# Patient Record
Sex: Female | Born: 1985 | Race: Black or African American | Hispanic: No | Marital: Single | Smoking: Former smoker
Health system: Southern US, Community
[De-identification: ages and names within clinical notes are randomized; demographics above are authoritative.]

## PROBLEM LIST (undated history)

## (undated) DIAGNOSIS — G43909 Migraine, unspecified, not intractable, without status migrainosus: Secondary | ICD-10-CM

## (undated) DIAGNOSIS — I10 Essential (primary) hypertension: Secondary | ICD-10-CM

---

## 2012-09-11 ENCOUNTER — Emergency Department (HOSPITAL_BASED_OUTPATIENT_CLINIC_OR_DEPARTMENT_OTHER)
Admission: EM | Admit: 2012-09-11 | Discharge: 2012-09-11 | Disposition: A | Discharge status: Home / Self Care | Payer: Self-pay | Attending: Emergency Medicine | Admitting: Emergency Medicine

## 2012-09-11 ENCOUNTER — Encounter (HOSPITAL_BASED_OUTPATIENT_CLINIC_OR_DEPARTMENT_OTHER): Payer: Self-pay

## 2012-09-11 DIAGNOSIS — R51 Headache: Secondary | ICD-10-CM | POA: Insufficient documentation

## 2012-09-11 DIAGNOSIS — Z87891 Personal history of nicotine dependence: Secondary | ICD-10-CM | POA: Insufficient documentation

## 2012-09-11 DIAGNOSIS — G43909 Migraine, unspecified, not intractable, without status migrainosus: Secondary | ICD-10-CM

## 2012-09-11 MED ORDER — METOCLOPRAMIDE HCL 5 MG/ML IJ SOLN
10.0000 mg | Freq: Once | INTRAMUSCULAR | Status: AC
  Administered 2012-09-11: 10 mg via INTRAMUSCULAR
  Filled 2012-09-11: qty 2

## 2012-09-11 MED ORDER — DIPHENHYDRAMINE HCL 50 MG/ML IJ SOLN
25.0000 mg | Freq: Once | INTRAMUSCULAR | Status: AC
  Administered 2012-09-11: 25 mg via INTRAMUSCULAR
  Filled 2012-09-11: qty 1

## 2012-09-11 MED ORDER — KETOROLAC TROMETHAMINE 60 MG/2ML IM SOLN
60.0000 mg | Freq: Once | INTRAMUSCULAR | Status: AC
  Administered 2012-09-11: 60 mg via INTRAMUSCULAR
  Filled 2012-09-11: qty 2

## 2012-09-11 NOTE — ED Provider Notes (Signed)
History     CSN: [id number]  Arrival date & time 09/11/12  4098   First MD Initiated Contact with Patient 09/11/12 0535      Chief Complaint  Patient presents with  . Headache    (Consider location/radiation/quality/duration/timing/severity/associated sxs/prior treatment) Patient is a 26 y.o. female presenting with headaches. The history is provided by the patient. No language interpreter was used.  Headache  This is a new problem. The current episode started more than 2 days ago. The problem occurs constantly. The problem has been gradually worsening. The headache is associated with emotional stress. The pain is located in the frontal region. The quality of the pain is described as throbbing. The pain is severe. The pain does not radiate. Pertinent negatives include no anorexia, no fever, no malaise/fatigue, no chest pressure, no near-syncope, no orthopnea, no palpitations, no syncope and no shortness of breath. She has tried NSAIDs for the symptoms. The treatment provided mild relief.    History reviewed. No pertinent past medical history.  History reviewed. No pertinent past surgical history.  History reviewed. No pertinent family history.  History  Substance Use Topics  . Smoking status: Former Games developer  . Smokeless tobacco: Not on file  . Alcohol Use: No    OB History    Grav Para Term Preterm Abortions TAB SAB Ect Mult Living                  Review of Systems  Constitutional: Negative for fever and malaise/fatigue.  HENT: Negative for neck pain and neck stiffness.   Eyes: Negative for photophobia and visual disturbance.  Respiratory: Negative for shortness of breath.   Cardiovascular: Negative for palpitations, orthopnea, syncope and near-syncope.  Gastrointestinal: Negative for anorexia.  Skin: Negative for rash.  Neurological: Positive for headaches. Negative for dizziness, tremors, seizures, syncope, facial asymmetry, speech difficulty, light-headedness and  numbness.  All other systems reviewed and are negative.    Allergies  Review of patient's allergies indicates no known allergies.  Home Medications  No current outpatient prescriptions on file.  BP 129/90  Pulse 64  Temp 97.8 F (36.6 C) (Oral)  Resp 20  SpO2 100%  Physical Exam  Constitutional: She is oriented to person, place, and time. She appears well-developed and well-nourished. No distress.  HENT:  Head: Normocephalic and atraumatic.  Right Ear: External ear normal.  Left Ear: External ear normal.  Mouth/Throat: Oropharynx is clear and moist.  Eyes: Conjunctivae normal and EOM are normal. Pupils are equal, round, and reactive to light.  Neck: Normal range of motion. Neck supple.  Cardiovascular: Normal rate and regular rhythm.   Pulmonary/Chest: Effort normal and breath sounds normal. She has no wheezes. She has no rales.  Abdominal: Soft. Bowel sounds are normal. There is no tenderness. There is no rebound and no guarding.  Musculoskeletal: Normal range of motion. She exhibits no edema.  Lymphadenopathy:    She has no cervical adenopathy.  Neurological: She is alert and oriented to person, place, and time. She has normal reflexes. No cranial nerve deficit.  Skin: Skin is warm and dry.  Psychiatric: She has a normal mood and affect.    ED Course  Procedures (including critical care time)   Labs Reviewed  PREGNANCY, URINE   No results found.   No diagnosis found.    MDM  No weakness nor numbness no changes in cognition.  No travel exposure no tick bites.  No indication for imaging nor LP at this time.  Return  for fevers, stiff neck weakness numbness or changes in cognition.          Jasmine Awe, MD 09/11/12 [phone]

## 2012-09-11 NOTE — ED Notes (Signed)
Patient waiting on mother to come to drive her home.

## 2012-09-11 NOTE — ED Notes (Signed)
Patient calling mother to transport her home.

## 2012-09-11 NOTE — ED Notes (Signed)
Pt sts has had H/A x 3 days became worst

## 2012-11-09 ENCOUNTER — Telehealth: Payer: Self-pay

## 2012-11-09 NOTE — Telephone Encounter (Signed)
TEST!!!

## 2013-05-19 ENCOUNTER — Emergency Department (HOSPITAL_BASED_OUTPATIENT_CLINIC_OR_DEPARTMENT_OTHER)
Admission: EM | Admit: 2013-05-19 | Discharge: 2013-05-19 | Disposition: A | Discharge status: Home / Self Care | Payer: Self-pay | Attending: Emergency Medicine | Admitting: Emergency Medicine

## 2013-05-19 ENCOUNTER — Encounter (HOSPITAL_BASED_OUTPATIENT_CLINIC_OR_DEPARTMENT_OTHER): Payer: Self-pay | Admitting: *Deleted

## 2013-05-19 DIAGNOSIS — Y929 Unspecified place or not applicable: Secondary | ICD-10-CM | POA: Insufficient documentation

## 2013-05-19 DIAGNOSIS — Z87891 Personal history of nicotine dependence: Secondary | ICD-10-CM | POA: Insufficient documentation

## 2013-05-19 DIAGNOSIS — T6391XA Toxic effect of contact with unspecified venomous animal, accidental (unintentional), initial encounter: Secondary | ICD-10-CM | POA: Insufficient documentation

## 2013-05-19 DIAGNOSIS — T63461A Toxic effect of venom of wasps, accidental (unintentional), initial encounter: Secondary | ICD-10-CM | POA: Insufficient documentation

## 2013-05-19 DIAGNOSIS — L299 Pruritus, unspecified: Secondary | ICD-10-CM | POA: Insufficient documentation

## 2013-05-19 DIAGNOSIS — Y9389 Activity, other specified: Secondary | ICD-10-CM | POA: Insufficient documentation

## 2013-05-19 MED ORDER — DIPHENHYDRAMINE HCL 50 MG/ML IJ SOLN
25.0000 mg | Freq: Once | INTRAMUSCULAR | Status: AC
  Administered 2013-05-19: 25 mg via INTRAMUSCULAR
  Filled 2013-05-19: qty 1

## 2013-05-19 MED ORDER — DIPHENHYDRAMINE HCL 25 MG PO TABS: 25.0000 mg | ORAL_TABLET | Freq: Four times a day (QID) | ORAL | Status: DC | PRN

## 2013-05-19 MED ORDER — CEPHALEXIN 500 MG PO CAPS: 500.0000 mg | ORAL_CAPSULE | Freq: Four times a day (QID) | ORAL | Status: DC

## 2013-05-19 MED ORDER — CEPHALEXIN 250 MG PO CAPS
250.0000 mg | ORAL_CAPSULE | Freq: Once | ORAL | Status: AC
  Administered 2013-05-19: 250 mg via ORAL
  Filled 2013-05-19: qty 1

## 2013-05-19 NOTE — ED Provider Notes (Signed)
Medical screening examination/treatment/procedure(s) were performed by non-physician practitioner and as supervising physician I was immediately available for consultation/collaboration.   Charles B. Sheldon, MD 05/19/13 1709 

## 2013-05-19 NOTE — ED Notes (Addendum)
Pt states she was stung by a bee yesterday on her right forearm and today area is more swollen and red. +itching. Area is approx 6"X3"

## 2013-05-19 NOTE — ED Provider Notes (Signed)
History     CSN: [id number]  Arrival date & time 05/19/13  1553   First MD Initiated Contact with Patient 05/19/13 1635      Chief Complaint  Patient presents with  . Insect Bite    (Consider location/radiation/quality/duration/timing/severity/associated sxs/prior treatment) HPI Comments: Patient is a 27 year old female who presents with a bee sting that occurred yesterday. Patient reports being stung by a bee on her right forearm yesterday and since then, has been experiencing increased redness to the affected area. Patient also reports the color change is spreading. Patient has not tried anything for symptoms. Patient reports associated tenderness to the area. No aggravating/alleviating factors.    History reviewed. No pertinent past medical history.  History reviewed. No pertinent past surgical history.  History reviewed. No pertinent family history.  History  Substance Use Topics  . Smoking status: Former Games developer  . Smokeless tobacco: Not on file  . Alcohol Use: No    OB History   Grav Para Term Preterm Abortions TAB SAB Ect Mult Living                  Review of Systems  Skin: Positive for color change.  All other systems reviewed and are negative.    Allergies  Review of patient's allergies indicates no known allergies.  Home Medications   Current Outpatient Rx  Name  Route  Sig  Dispense  Refill  . Norgestim-Eth Estrad Triphasic (ORTHO TRI-CYCLEN, 28, PO)   Oral   Take by mouth.           BP 124/76  Pulse 70  Temp(Src) 98.2 F (36.8 C) (Oral)  Resp 18  Ht 5\' 9"  (1.753 m)  Wt 140 lb (63.504 kg)  BMI 20.67 kg/m2  SpO2 100%  LMP 05/19/2013  Physical Exam  Nursing note and vitals reviewed. Constitutional: She is oriented to person, place, and time. She appears well-developed and well-nourished. No distress.  HENT:  Head: Normocephalic and atraumatic.  Eyes: Conjunctivae are normal.  Neck: Normal range of motion.  Cardiovascular: Normal  rate and regular rhythm.  Exam reveals no gallop and no friction rub.   No murmur heard. Pulmonary/Chest: Effort normal and breath sounds normal. She has no wheezes. She has no rales. She exhibits no tenderness.  Musculoskeletal: Normal range of motion.  Neurological: She is alert and oriented to person, place, and time. Coordination normal.  Speech is goal-oriented. Moves limbs without ataxia.   Skin: Skin is warm and dry.  6x3cm area of well-demarcated erythema and warmth. No open wound.  Psychiatric: She has a normal mood and affect. Her behavior is normal.    ED Course  Procedures (including critical care time)  Labs Reviewed - No data to display No results found.   1. Bee sting reaction, initial encounter       MDM  4:52 PM Patient will have IM benadryl for itching and likely localized allergic reaction. Patient afebrile with stable vitals. Patient will also have keflex here and be discharged with Keflex and benadryl. Patient instructed to return with worsening or concerning symptoms.         Emilia Beck, PA-C 05/19/13 1701

## 2015-03-31 ENCOUNTER — Encounter (HOSPITAL_BASED_OUTPATIENT_CLINIC_OR_DEPARTMENT_OTHER): Payer: Self-pay | Admitting: *Deleted

## 2015-03-31 ENCOUNTER — Emergency Department (HOSPITAL_BASED_OUTPATIENT_CLINIC_OR_DEPARTMENT_OTHER)
Admission: EM | Admit: 2015-03-31 | Discharge: 2015-03-31 | Disposition: A | Discharge status: Home / Self Care | Payer: 59 | Attending: Emergency Medicine | Admitting: Emergency Medicine

## 2015-03-31 DIAGNOSIS — Y9289 Other specified places as the place of occurrence of the external cause: Secondary | ICD-10-CM | POA: Diagnosis not present

## 2015-03-31 DIAGNOSIS — Z87891 Personal history of nicotine dependence: Secondary | ICD-10-CM | POA: Insufficient documentation

## 2015-03-31 DIAGNOSIS — Y9389 Activity, other specified: Secondary | ICD-10-CM | POA: Insufficient documentation

## 2015-03-31 DIAGNOSIS — W57XXXA Bitten or stung by nonvenomous insect and other nonvenomous arthropods, initial encounter: Secondary | ICD-10-CM | POA: Diagnosis not present

## 2015-03-31 DIAGNOSIS — L03115 Cellulitis of right lower limb: Secondary | ICD-10-CM | POA: Diagnosis not present

## 2015-03-31 DIAGNOSIS — L089 Local infection of the skin and subcutaneous tissue, unspecified: Secondary | ICD-10-CM

## 2015-03-31 DIAGNOSIS — Y998 Other external cause status: Secondary | ICD-10-CM | POA: Diagnosis not present

## 2015-03-31 DIAGNOSIS — S80861A Insect bite (nonvenomous), right lower leg, initial encounter: Secondary | ICD-10-CM | POA: Diagnosis present

## 2015-03-31 DIAGNOSIS — Z792 Long term (current) use of antibiotics: Secondary | ICD-10-CM | POA: Diagnosis not present

## 2015-03-31 MED ORDER — CEPHALEXIN 500 MG PO CAPS: 500.0000 mg | ORAL_CAPSULE | Freq: Two times a day (BID) | ORAL | Status: DC

## 2015-03-31 MED ORDER — DIPHENHYDRAMINE HCL 25 MG PO TABS: 25.0000 mg | ORAL_TABLET | Freq: Four times a day (QID) | ORAL | Status: DC

## 2015-03-31 MED ORDER — DIPHENHYDRAMINE HCL 25 MG PO CAPS
25.0000 mg | ORAL_CAPSULE | Freq: Once | ORAL | Status: AC
  Administered 2015-03-31: 25 mg via ORAL
  Filled 2015-03-31: qty 1

## 2015-03-31 NOTE — ED Provider Notes (Signed)
CSN: [id number]     Arrival date & time 03/31/15  2030 History   First MD Initiated Contact with Patient 03/31/15 2154     Chief Complaint  Patient presents with  . Insect Bite     (Consider location/radiation/quality/duration/timing/severity/associated sxs/prior Treatment) HPI  Pt is a 29yo female presenting to ED with c/o gradually worsening painful and pruritic pustule on Right lower leg that started yesterday. Pt concerned she was bit by a spider but did not see any insect.  Pain is 6/10.  No medication tried for symptoms. Denies any other rash. No known allergies. Denies fever, chills, n/v/d. Denies bleeding or discharge from pustule.  No other symptoms.   History reviewed. No pertinent past medical history. History reviewed. No pertinent past surgical history. No family history on file. History  Substance Use Topics  . Smoking status: Former Games developer  . Smokeless tobacco: Not on file  . Alcohol Use: No   OB History    No data available     Review of Systems  Constitutional: Negative for fever and chills.  Gastrointestinal: Negative for nausea and vomiting.  Musculoskeletal: Negative for myalgias, back pain and arthralgias.  Skin: Positive for rash and wound.  All other systems reviewed and are negative.     Allergies  Review of patient's allergies indicates no known allergies.  Home Medications   Prior to Admission medications   Medication Sig Start Date End Date Taking? Authorizing Provider  cephALEXin (KEFLEX) 500 MG capsule Take 1 capsule (500 mg total) by mouth 4 (four) times daily. 05/19/13   Kaitlyn Szekalski, PA-C  cephALEXin (KEFLEX) 500 MG capsule Take 1 capsule (500 mg total) by mouth 2 (two) times daily. 03/31/15   Junius Finner, PA-C  diphenhydrAMINE (BENADRYL) 25 MG tablet Take 1 tablet (25 mg total) by mouth every 6 (six) hours as needed for itching (Rash). 05/19/13   Kaitlyn Szekalski, PA-C  diphenhydrAMINE (BENADRYL) 25 MG tablet Take 1 tablet (25 mg  total) by mouth every 6 (six) hours. 03/31/15   Junius Finner, PA-C  Norgestim-Eth Estrad Triphasic (ORTHO TRI-CYCLEN, 28, PO) Take by mouth.    Historical Provider, MD   BP 120/73 mmHg  Pulse 58  Temp(Src) 98.5 F (36.9 C) (Oral)  Resp 18  Ht  (1.753 m)  Wt 140 lb (63.504 kg)  BMI 20.67 kg/m2  SpO2 100%  LMP 03/17/2015 Physical Exam  Constitutional: She is oriented to person, place, and time. She appears well-developed and well-nourished.  HENT:  Head: Normocephalic and atraumatic.  Eyes: EOM are normal.  Neck: Normal range of motion.  Cardiovascular: Normal rate.   Pulmonary/Chest: Effort normal.  Musculoskeletal: Normal range of motion.  Neurological: She is alert and oriented to person, place, and time.  Skin: Skin is warm and dry. There is erythema.  Right lower leg, medial aspect: pustule with 1cm area surrounding erythema. Mild tenderness to touch. No active discharge or bleeding.    Psychiatric: She has a normal mood and affect. Her behavior is normal.  Nursing note and vitals reviewed.   ED Course  Procedures   INCISION AND DRAINAGE Performed by: Junius Finner A. Consent: Verbal consent obtained. Risks and benefits: risks, benefits and alternatives were discussed Type: abscess  Body area: Right lower leg, medial aspect  Anesthesia: none  Incision was made with an 18gtt needle  Complexity: simple  Drainage: clear  Drainage amount: scant  Packing material: bacitracin and bandage placed  Patient tolerance: Patient tolerated the procedure well with no immediate  complications.   Labs Review Labs Reviewed - No data to display  Imaging Review No results found.   EKG Interpretation None      MDM   Final diagnoses:  Cellulitis of right lower leg  Skin pustule    Pt presenting to ED with possible insect bite to Right lower leg.  ID performed with scan clear discharge. Cellulitis with small abscess vs contact dermatitis.  Will place on  keflex and try itching with benadryl.  Given no other rash, lower concern for allergic reaction.  Home care instructions provided. Rx: keflex. Return precautions provided. Pt verbalized understanding and agreement with tx plan.     Junius Finnerrin O'Malley, PA-C 03/31/15 2231  Geoffery Lyonsouglas Delo, MD 03/31/15 [phone]

## 2015-03-31 NOTE — ED Notes (Signed)
Possible insect bite to her right lower leg. Pustule noted.

## 2015-03-31 NOTE — Discharge Instructions (Signed)
Abscess °An abscess (boil or furuncle) is an infected area on or under the skin. This area is filled with yellowish-white fluid (pus) and other material (debris). °HOME CARE  °· Only take medicines as told by your doctor. °· If you were given antibiotic medicine, take it as directed. Finish the medicine even if you start to feel better. °· If gauze is used, follow your doctor's directions for changing the gauze. °· To avoid spreading the infection: °¨ Keep your abscess covered with a bandage. °¨ Wash your hands well. °¨ Do not share personal care items, towels, or whirlpools with others. °¨ Avoid skin contact with others. °· Keep your skin and clothes clean around the abscess. °· Keep all doctor visits as told. °GET HELP RIGHT AWAY IF:  °· You have more pain, puffiness (swelling), or redness in the wound site. °· You have more fluid or blood coming from the wound site. °· You have muscle aches, chills, or you feel sick. °· You have a fever. °MAKE SURE YOU:  °· Understand these instructions. °· Will watch your condition. °· Will get help right away if you are not doing well or get worse. °Document Released: 05/10/2008 Document Revised: 05/23/2012 Document Reviewed: 02/04/2012 °Exit[employer]® Patient Information ©2015 [employer]. This information is not intended to replace advice given to you by your health care provider. Make sure you discuss any questions you have with your health care provider. ° °

## 2016-08-03 ENCOUNTER — Emergency Department (HOSPITAL_BASED_OUTPATIENT_CLINIC_OR_DEPARTMENT_OTHER)
Admission: EM | Admit: 2016-08-03 | Discharge: 2016-08-04 | Disposition: A | Discharge status: Home / Self Care | Payer: BLUE CROSS/BLUE SHIELD | Attending: Emergency Medicine | Admitting: Emergency Medicine

## 2016-08-03 DIAGNOSIS — N760 Acute vaginitis: Secondary | ICD-10-CM | POA: Insufficient documentation

## 2016-08-03 DIAGNOSIS — N390 Urinary tract infection, site not specified: Secondary | ICD-10-CM | POA: Insufficient documentation

## 2016-08-03 DIAGNOSIS — Z87891 Personal history of nicotine dependence: Secondary | ICD-10-CM | POA: Insufficient documentation

## 2016-08-03 DIAGNOSIS — B9689 Other specified bacterial agents as the cause of diseases classified elsewhere: Secondary | ICD-10-CM

## 2016-08-03 DIAGNOSIS — R300 Dysuria: Secondary | ICD-10-CM | POA: Diagnosis present

## 2016-08-03 LAB — WET PREP, GENITAL
Sperm: NONE SEEN
TRICH WET PREP: NONE SEEN
YEAST WET PREP: NONE SEEN

## 2016-08-03 LAB — URINALYSIS, ROUTINE W REFLEX MICROSCOPIC
GLUCOSE, UA: NEGATIVE mg/dL
KETONES UR: 15 mg/dL — AB
Nitrite: POSITIVE — AB
PH: 6 (ref 5.0–8.0)
Protein, ur: 100 mg/dL — AB
Specific Gravity, Urine: 1.022 (ref 1.005–1.030)

## 2016-08-03 LAB — PREGNANCY, URINE: Preg Test, Ur: NEGATIVE

## 2016-08-03 LAB — URINE MICROSCOPIC-ADD ON

## 2016-08-03 MED ORDER — DOXYCYCLINE HYCLATE 100 MG PO TABS
100.0000 mg | ORAL_TABLET | Freq: Once | ORAL | Status: AC
  Administered 2016-08-03: 100 mg via ORAL
  Filled 2016-08-03: qty 1

## 2016-08-03 MED ORDER — CEFTRIAXONE SODIUM 250 MG IJ SOLR
250.0000 mg | Freq: Once | INTRAMUSCULAR | Status: AC
  Administered 2016-08-03: 250 mg via INTRAMUSCULAR
  Filled 2016-08-03: qty 250

## 2016-08-03 NOTE — ED Triage Notes (Signed)
Vaginal bleeding since yesterday. Abdominal cramps. Dysuria. States she feels like she has a UTI.

## 2016-08-03 NOTE — ED Provider Notes (Signed)
[facility] Provider Note   CSN: [id number] Arrival date & time: 08/03/16  2104  By signing my name below, I, Nelwyn Salisbury, attest that this documentation has been prepared under the direction and in the presence of non-physician practitioner, Mattie Marlin, PA-C. Electronically Signed: Nelwyn Salisbury, Scribe. 08/03/2016. 9:46 PM.  History   Chief Complaint Chief Complaint  Patient presents with  . Vaginal Bleeding   The history is provided by the patient. No language interpreter was used.     HPI Comments:  Leslie Gray is a 30 y.o. female who presents to the Emergency Department complaining of sudden-onset worsening intermittent Dysuria and increased urinary frequency onset three days. Pt reports associated suprapubic abdominal pain that she describes as radiating through her stomach, exacerbated on palpation. Associated vaginal bleeding. Patient states she has bleeding only when she wipes. She denies any fevers, chills, nausea, vomiting, flank pain, vaginal pain, vaginal discharge or new sexual partners. Pt notes that she has had STIs before.  No past medical history on file.  There are no active problems to display for this patient.   No past surgical history on file.  OB History    No data available       Home Medications    Prior to Admission medications   Medication Sig Start Date End Date Taking? Authorizing Provider  diphenhydrAMINE (BENADRYL) 25 MG tablet Take 1 tablet (25 mg total) by mouth every 6 (six) hours as needed for itching (Rash). 05/19/13   Kaitlyn Szekalski, PA-C  diphenhydrAMINE (BENADRYL) 25 MG tablet Take 1 tablet (25 mg total) by mouth every 6 (six) hours. 03/31/15   Junius Finner, PA-C  fluconazole (DIFLUCAN) 200 MG tablet Take 1 tablet (200 mg total) by mouth daily. Take one tablet today and if you are still experiencing symptoms in 3 days take the additional pill 08/04/16 08/11/16  Jerre Simon, PA  metroNIDAZOLE (FLAGYL) 500 MG  tablet Take 1 tablet (500 mg total) by mouth 2 (two) times daily. 08/04/16   Jerre Simon, PA  nitrofurantoin, macrocrystal-monohydrate, (MACROBID) 100 MG capsule Take 1 capsule (100 mg total) by mouth 2 (two) times daily. 08/04/16   Jerre Simon, PA  Norgestim-Eth Estrad Triphasic (ORTHO TRI-CYCLEN, 28, PO) Take by mouth.    Historical Provider, MD    Family History No family history on file.  Social History Social History  Substance Use Topics  . Smoking status: Former Games developer  . Smokeless tobacco: Not on file  . Alcohol use No     Allergies   Review of patient's allergies indicates no known allergies.   Review of Systems Review of Systems  Constitutional: Negative for chills and fever.  Gastrointestinal: Negative for abdominal pain, nausea and vomiting.  Genitourinary: Positive for dysuria, pelvic pain and vaginal bleeding. Negative for flank pain and vaginal pain.  Musculoskeletal: Negative for back pain.  Skin: Negative for rash.     Physical Exam Updated Vital Signs BP (!) 160/101 (BP Location: Left Arm)   Pulse 69   Temp 98.4 F (36.9 C) (Oral)   Resp 16   Ht 5\' 9"  (1.753 m)   Wt 150 lb (68 kg)   LMP 07/03/2016   SpO2 100%   BMI 22.15 kg/m   Physical Exam  Constitutional: She appears well-developed and well-nourished. No distress.  HENT:  Head: Normocephalic and atraumatic.  Eyes: Conjunctivae are normal.  Pulmonary/Chest: Effort normal. No respiratory distress.  Abdominal: She exhibits no distension and no mass. There is tenderness  in the suprapubic area. There is no rigidity, no guarding and no CVA tenderness.  Suprapubic tenderness  Genitourinary:  Genitourinary Comments: Exam performed by Jerre SimonJessica L Focht,  exam chaperoned Date: 08/04/2016 Pelvic exam: normal external genitalia without evidence of trauma. VULVA: normal appearing vulva with no masses, tenderness or lesion. VAGINA: normal appearing vagina with normal color and discharge, no  lesions. CERVIX: normal appearing cervix without lesions, mild cervical motion tenderness, cervical os closed with out purulent discharge; vaginal discharge - white, creamy, thin and no blood noted in vaginal vault, Wet prep and DNA probe for chlamydia and GC obtained.   ADNEXA: normal adnexa in size, nontender and no masses UTERUS: uterus is normal size, shape, consistency and nontender.    Musculoskeletal: Normal range of motion.  Neurological: She is alert. Coordination normal.  Skin: Skin is warm and dry. She is not diaphoretic.  Psychiatric: She has a normal mood and affect. Her behavior is normal.  Nursing note and vitals reviewed.    ED Treatments / Results  DIAGNOSTIC STUDIES:  Oxygen Saturation is 100% on RA, normal by my interpretation.    COORDINATION OF CARE:  10:56 PM Discussed treatment plan with pt at bedside and pt agreed to plan.  Labs (all labs ordered are listed, but only abnormal results are displayed) Labs Reviewed  WET PREP, GENITAL - Abnormal; Notable for the following:       Result Value   Clue Cells Wet Prep HPF POC PRESENT (*)    WBC, Wet Prep HPF POC MANY (*)    All other components within normal limits  URINALYSIS, ROUTINE W REFLEX MICROSCOPIC (NOT AT [facility]) - Abnormal; Notable for the following:    Color, Urine ORANGE (*)    APPearance CLOUDY (*)    Hgb urine dipstick LARGE (*)    Bilirubin Urine SMALL (*)    Ketones, ur 15 (*)    Protein, ur 100 (*)    Nitrite POSITIVE (*)    Leukocytes, UA LARGE (*)    All other components within normal limits  URINE MICROSCOPIC-ADD ON - Abnormal; Notable for the following:    Squamous Epithelial / LPF 6-30 (*)    Bacteria, UA MANY (*)    All other components within normal limits  URINE CULTURE  PREGNANCY, URINE  GC/CHLAMYDIA PROBE AMP ([place]) NOT AT [facility]    EKG  EKG Interpretation None       Radiology No results found.  Procedures Procedures (including critical care time)  Medications  Ordered in ED Medications  cefTRIAXone (ROCEPHIN) injection 250 mg (250 mg Intramuscular Given 08/03/16 2350)  doxycycline (VIBRA-TABS) tablet 100 mg (100 mg Oral Given 08/03/16 2350)  azithromycin (ZITHROMAX) powder 1 g (1 g Oral Given 08/04/16 0041)     Initial Impression / Assessment and Plan / ED Course  I have reviewed the triage vital signs and the nursing notes.  Pertinent labs & imaging results that were available during my care of the patient were reviewed by me and considered in my medical decision making (see chart for details).  Clinical Course   Pt has been diagnosed with a UTI. Pt is afebrile, no CVA tenderness, normotensive, and denies N/VLess concerning for pyelonephritis. Pt to be dc home with antibiotics and instructions to follow up with PCP if symptoms persist. Patient did not have blood in the vaginal vault during exam. Blood is likely from UTI. Patient with a history of unprotected sex and history of STDs. Patient treated in the ED for  STI with Rocephin and azithromycin. Patient advised to inform and treat all sexual partners if test results are positive.  Patient also found to have bacterial vaginosis. Will discharge patient with Diflucan and Flagyl. Patient states she gets yeast infections when she takes antibiotics. Pt advised on safe sex practices and understands that they have GC/Chlamydia cultures pending and will result in 2-3 days. HIV and RPR sent. No concern for PID. Discussed return precautions. Instructed patient to follow-up with the [facility] in 3 days if her symptoms are not improving and to have her blood pressure reevaluated. Urine culture pending. Pt appears safe for discharge.   I personally performed the services described in this documentation, which was scribed in my presence. The recorded information has been reviewed and is accurate.   Pt case discussed with Dr. Read Drivers who agrees with the above plan  Final Clinical  Impressions(s) / ED Diagnoses   Final diagnoses:  BV (bacterial vaginosis)  UTI (lower urinary tract infection)    New Prescriptions New Prescriptions   FLUCONAZOLE (DIFLUCAN) 200 MG TABLET    Take 1 tablet (200 mg total) by mouth daily. Take one tablet today and if you are still experiencing symptoms in 3 days take the additional pill   METRONIDAZOLE (FLAGYL) 500 MG TABLET    Take 1 tablet (500 mg total) by mouth 2 (two) times daily.   NITROFURANTOIN, MACROCRYSTAL-MONOHYDRATE, (MACROBID) 100 MG CAPSULE    Take 1 capsule (100 mg total) by mouth 2 (two) times daily.     Jerre Simon, PA 08/04/16 0047    Paula Libra, MD 08/04/16 [phone]

## 2016-08-04 MED ORDER — METRONIDAZOLE 500 MG PO TABS: 500.0000 mg | ORAL_TABLET | Freq: Two times a day (BID) | ORAL | 0 refills | Status: DC

## 2016-08-04 MED ORDER — FLUCONAZOLE 200 MG PO TABS: 200.0000 mg | ORAL_TABLET | Freq: Every day | ORAL | 0 refills | Status: AC

## 2016-08-04 MED ORDER — NITROFURANTOIN MONOHYD MACRO 100 MG PO CAPS: 100.0000 mg | ORAL_CAPSULE | Freq: Two times a day (BID) | ORAL | 0 refills | Status: DC

## 2016-08-04 MED ORDER — AZITHROMYCIN 1 G PO PACK
1.0000 g | PACK | Freq: Once | ORAL | Status: AC
  Administered 2016-08-04: 1 g via ORAL
  Filled 2016-08-04: qty 1

## 2016-08-04 NOTE — Discharge Instructions (Signed)
Take the Flagyl, Macrobid and Diflucan as prescribed. Be sure to complete the entire course of these. Do not drink alcohol while taking Flagyl. If you experience a rash stopped taking the antibiotics and return to emergency department. Follow-up with the [facility] in 2-3 days if your symptoms are not improving. Your HIV, syphilis, gonorrhea, chlamydia testing is still pending. You were treated today for gonorrhea and chlamydia. Inform your sexual partners if your test results are positive. Use condoms for intercourse.  Return to emergency department if your symptoms worsen, or you experience fever, chills, nausea, vomiting, or any other concerning symptoms.

## 2016-08-05 LAB — GC/CHLAMYDIA PROBE AMP (~~LOC~~) NOT AT ARMC
Chlamydia: NEGATIVE
Neisseria Gonorrhea: NEGATIVE

## 2016-08-06 LAB — URINE CULTURE: Culture: >=100000 — AB

## 2016-08-09 ENCOUNTER — Encounter (HOSPITAL_BASED_OUTPATIENT_CLINIC_OR_DEPARTMENT_OTHER): Payer: Self-pay

## 2016-08-09 ENCOUNTER — Emergency Department (HOSPITAL_BASED_OUTPATIENT_CLINIC_OR_DEPARTMENT_OTHER)
Admission: EM | Admit: 2016-08-09 | Discharge: 2016-08-09 | Disposition: A | Discharge status: Home / Self Care | Payer: BLUE CROSS/BLUE SHIELD | Attending: Emergency Medicine | Admitting: Emergency Medicine

## 2016-08-09 DIAGNOSIS — Z87891 Personal history of nicotine dependence: Secondary | ICD-10-CM | POA: Insufficient documentation

## 2016-08-09 DIAGNOSIS — R112 Nausea with vomiting, unspecified: Secondary | ICD-10-CM | POA: Diagnosis present

## 2016-08-09 DIAGNOSIS — G43909 Migraine, unspecified, not intractable, without status migrainosus: Secondary | ICD-10-CM | POA: Insufficient documentation

## 2016-08-09 HISTORY — DX: Migraine, unspecified, not intractable, without status migrainosus: G43.909

## 2016-08-09 LAB — CBC WITH DIFFERENTIAL/PLATELET
Basophils Absolute: 0 10*3/uL (ref 0.0–0.1)
Basophils Relative: 1 %
EOS PCT: 2 %
Eosinophils Absolute: 0.1 10*3/uL (ref 0.0–0.7)
HCT: 37.8 % (ref 36.0–46.0)
Hemoglobin: 12.7 g/dL (ref 12.0–15.0)
LYMPHS ABS: 1.5 10*3/uL (ref 0.7–4.0)
LYMPHS PCT: 47 %
MCH: 31.6 pg (ref 26.0–34.0)
MCHC: 33.6 g/dL (ref 30.0–36.0)
MCV: 94 fL (ref 78.0–100.0)
MONO ABS: 0.3 10*3/uL (ref 0.1–1.0)
MONOS PCT: 11 %
Neutro Abs: 1.2 10*3/uL — ABNORMAL LOW (ref 1.7–7.7)
Neutrophils Relative %: 39 %
PLATELETS: 278 10*3/uL (ref 150–400)
RBC: 4.02 MIL/uL (ref 3.87–5.11)
RDW: 13.2 % (ref 11.5–15.5)
WBC: 3.1 10*3/uL — ABNORMAL LOW (ref 4.0–10.5)

## 2016-08-09 LAB — BASIC METABOLIC PANEL
Anion gap: 7 (ref 5–15)
BUN: 11 mg/dL (ref 6–20)
CHLORIDE: 109 mmol/L (ref 101–111)
CO2: 24 mmol/L (ref 22–32)
Calcium: 8.5 mg/dL — ABNORMAL LOW (ref 8.9–10.3)
Creatinine, Ser: 0.67 mg/dL (ref 0.44–1.00)
GFR calc Af Amer: >60 mL/min (ref >60)
GLUCOSE: 96 mg/dL (ref 65–99)
POTASSIUM: 3.4 mmol/L — AB (ref 3.5–5.1)
Sodium: 140 mmol/L (ref 135–145)

## 2016-08-09 MED ORDER — KETOROLAC TROMETHAMINE 30 MG/ML IJ SOLN
30.0000 mg | Freq: Once | INTRAMUSCULAR | Status: AC
  Administered 2016-08-09: 30 mg via INTRAVENOUS
  Filled 2016-08-09: qty 1

## 2016-08-09 MED ORDER — ONDANSETRON 4 MG PO TBDP: 4.0000 mg | ORAL_TABLET | Freq: Three times a day (TID) | ORAL | 0 refills | Status: DC | PRN

## 2016-08-09 MED ORDER — PROCHLORPERAZINE EDISYLATE 5 MG/ML IJ SOLN
10.0000 mg | Freq: Once | INTRAMUSCULAR | Status: AC
  Administered 2016-08-09: 10 mg via INTRAVENOUS
  Filled 2016-08-09: qty 2

## 2016-08-09 MED ORDER — SODIUM CHLORIDE 0.9 % IV BOLUS (SEPSIS)
1000.0000 mL | Freq: Once | INTRAVENOUS | Status: AC
  Administered 2016-08-09: 1000 mL via INTRAVENOUS

## 2016-08-09 MED ORDER — NAPROXEN 500 MG PO TABS: 500.0000 mg | ORAL_TABLET | Freq: Two times a day (BID) | ORAL | 0 refills | Status: DC

## 2016-08-09 MED ORDER — SUMATRIPTAN SUCCINATE 100 MG PO TABS: 100.0000 mg | ORAL_TABLET | ORAL | 0 refills | Status: AC | PRN

## 2016-08-09 MED ORDER — METOCLOPRAMIDE HCL 5 MG/ML IJ SOLN
10.0000 mg | Freq: Once | INTRAMUSCULAR | Status: AC
  Administered 2016-08-09: 10 mg via INTRAVENOUS
  Filled 2016-08-09: qty 2

## 2016-08-09 NOTE — ED Notes (Signed)
IV start x 2 unsuccessful. 

## 2016-08-09 NOTE — ED Triage Notes (Signed)
Patient arrives to ED for migraine, [facility] and reports no decrease of pain. Patient reports vomiting and photophobia.

## 2016-08-09 NOTE — ED Provider Notes (Signed)
[facility] DEPT Provider Note   CSN: [id number] Arrival date & time: 08/09/16  0946     History   Chief Complaint Chief Complaint  Patient presents with  . Migraine    HPI Leslie Gray is a 30 y.o. female.  HPI   Leslie Gray is a 30 y.o. female, with a history of Migraine, presenting to the ED with a headache intermittent for the last three days. Pt states her headache is consistent with her previous migraines, but continues to return. Pain is bilateral frontal, moderate, throbbing, radiating towards the back of the head. [facility] Sept [facility], given IV fluids and "some medications," which helped for a while. Discharged with fioricet and phenergan. States the fioricet has not been working. Endorses nausea and one episode of vomiting since the pain began. Also endorses photophobia. Denies fever/chills, visual disturbances, dizziness, LOC, trauma, neck stiffness, or any other complaints.    History reviewed. No pertinent past medical history.  There are no active problems to display for this patient.   History reviewed. No pertinent surgical history.  OB History    No data available       Home Medications    Prior to Admission medications   Medication Sig Start Date End Date Taking? Authorizing Provider  diphenhydrAMINE (BENADRYL) 25 MG tablet Take 1 tablet (25 mg total) by mouth every 6 (six) hours as needed for itching (Rash). 05/19/13   Kaitlyn Szekalski, PA-C  diphenhydrAMINE (BENADRYL) 25 MG tablet Take 1 tablet (25 mg total) by mouth every 6 (six) hours. 03/31/15   Junius FinnerErin O'Malley, PA-C  fluconazole (DIFLUCAN) 200 MG tablet Take 1 tablet (200 mg total) by mouth daily. Take one tablet today and if you are still experiencing symptoms in 3 days take the additional pill 08/04/16 08/11/16  Jerre SimonJessica L Focht, PA  metroNIDAZOLE (FLAGYL) 500 MG tablet Take 1 tablet (500 mg total) by mouth 2 (two) times daily. 08/04/16   Jerre SimonJessica L Focht, PA  naproxen (NAPROSYN)  500 MG tablet Take 1 tablet (500 mg total) by mouth 2 (two) times daily. 08/09/16   Rilya Longo C Ceairra Mccarver, PA-C  nitrofurantoin, macrocrystal-monohydrate, (MACROBID) 100 MG capsule Take 1 capsule (100 mg total) by mouth 2 (two) times daily. 08/04/16   Jerre SimonJessica L Focht, PA  Norgestim-Eth Estrad Triphasic (ORTHO TRI-CYCLEN, 28, PO) Take by mouth.    Historical Provider, MD  ondansetron (ZOFRAN ODT) 4 MG disintegrating tablet Take 1 tablet (4 mg total) by mouth every 8 (eight) hours as needed for nausea or vomiting. 08/09/16   Chellsie Gomer C Savir Blanke, PA-C  SUMAtriptan (IMITREX) 100 MG tablet Take 1 tablet (100 mg total) by mouth every 2 (two) hours as needed for migraine. May repeat in 2 hours if headache persists or recurs. 08/09/16   Anselm PancoastShawn C Kaile Bixler, PA-C    Family History History reviewed. No pertinent family history.  Social History Social History  Substance Use Topics  . Smoking status: Former Games developermoker  . Smokeless tobacco: Not on file  . Alcohol use No     Allergies   Review of patient's allergies indicates no known allergies.   Review of Systems Review of Systems  Constitutional: Negative for chills and fever.  Gastrointestinal: Positive for nausea and vomiting.  Musculoskeletal: Negative for neck pain and neck stiffness.  Neurological: Positive for headaches. Negative for dizziness, syncope, weakness, light-headedness and numbness.  All other systems reviewed and are negative.    Physical Exam Updated Vital Signs BP 133/95 (BP Location: Left  Arm)   Pulse (!) 56   Temp 98.3 F (36.8 C) (Oral)   Resp 17   Ht 5\' 9"  (1.753 m)   Wt 68 kg   LMP 08/09/2016   SpO2 100%   BMI 22.15 kg/m   Physical Exam  Constitutional: She is oriented to person, place, and time. She appears well-developed and well-nourished. No distress.  HENT:  Head: Normocephalic and atraumatic.  Eyes: Conjunctivae and EOM are normal. Pupils are equal, round, and reactive to light.  Neck: Normal range of motion. Neck supple.    Cardiovascular: Normal rate, regular rhythm, normal heart sounds and intact distal pulses.   Pulmonary/Chest: Effort normal and breath sounds normal. No respiratory distress.  Abdominal: Soft. There is no tenderness. There is no guarding.  Musculoskeletal: She exhibits no edema or tenderness.  Full ROM in all extremities and spine. No midline spinal tenderness.   Lymphadenopathy:    She has no cervical adenopathy.  Neurological: She is alert and oriented to person, place, and time. She has normal reflexes.  No sensory deficits. Strength 5/5 in all extremities. No gait disturbance. Coordination intact. Cranial nerves III-XII grossly intact. No facial droop.   Skin: Skin is warm and dry. Capillary refill takes less than 2 seconds. No rash noted. She is not diaphoretic.  Psychiatric: She has a normal mood and affect. Her behavior is normal.  Nursing note and vitals reviewed.    ED Treatments / Results  Labs (all labs ordered are listed, but only abnormal results are displayed) Labs Reviewed  BASIC METABOLIC PANEL - Abnormal; Notable for the following:       Result Value   Potassium 3.4 (*)    Calcium 8.5 (*)    All other components within normal limits  CBC WITH DIFFERENTIAL/PLATELET - Abnormal; Notable for the following:    WBC 3.1 (*)    Neutro Abs 1.2 (*)    All other components within normal limits    EKG  EKG Interpretation None       Radiology No results found.  Procedures Procedures (including critical care time)  Medications Ordered in ED Medications  sodium chloride 0.9 % bolus 1,000 mL (0 mLs Intravenous Stopped 08/09/16 1215)  prochlorperazine (COMPAZINE) injection 10 mg (10 mg Intravenous Given 08/09/16 1108)  ketorolac (TORADOL) 30 MG/ML injection 30 mg (30 mg Intravenous Given 08/09/16 1108)  metoCLOPramide (REGLAN) injection 10 mg (10 mg Intravenous Given 08/09/16 1108)     Initial Impression / Assessment and Plan / ED Course  I have reviewed the triage  vital signs and the nursing notes.  Pertinent labs & imaging results that were available during my care of the patient were reviewed by me and considered in my medical decision making (see chart for details).  Clinical Course    Patient with headache consistent with previous migraines. No red flag symptoms. Patient has been told to follow up with PCP, but has not taken steps to do so. A chart review reveals patient had a normal urine pregnancy test and normal head CT in the [facility] ED on 9/2. Patient's pain resolved in the ED. Suspect dehydration may have contributed to the patient's pain. Patient encouraged to follow-up with a PCP. Resources given. The patient was given instructions for home care as well as return precautions. Patient voices understanding of these instructions, accepts the plan, and is comfortable with discharge.  Vitals:   08/09/16 0956 08/09/16 1253  BP: 133/95 130/84  Pulse: (!) 56 (!) 50  Resp: 17  16  Temp: 98.3 F (36.8 C) 98.2 F (36.8 C)  TempSrc: Oral Oral  SpO2: 100% 100%  Weight: 68 kg   Height: 5\' 9"  (1.753 m)      Final Clinical Impressions(s) / ED Diagnoses   Final diagnoses:  Migraine without status migrainosus, not intractable, unspecified migraine type    New Prescriptions Discharge Medication List as of 08/09/2016 12:47 PM    START taking these medications   Details  naproxen (NAPROSYN) 500 MG tablet Take 1 tablet (500 mg total) by mouth 2 (two) times daily., Starting Mon 08/09/2016, Print    ondansetron (ZOFRAN ODT) 4 MG disintegrating tablet Take 1 tablet (4 mg total) by mouth every 8 (eight) hours as needed for nausea or vomiting., Starting Mon 08/09/2016, Print    SUMAtriptan (IMITREX) 100 MG tablet Take 1 tablet (100 mg total) by mouth every 2 (two) hours as needed for migraine. May repeat in 2 hours if headache persists or recurs., Starting Mon 08/09/2016, Print         Anselm Pancoast, PA-C 08/10/16 1055    Gwyneth Sprout, MD 08/10/16  1540

## 2016-08-09 NOTE — Discharge Instructions (Signed)
You have been seen today for a headache. There were no significant abnormalities on your lab work. Part of your pain was likely dehydration. You must consistently and almost constantly drink water to stay well-hydrated, especially when you have a headache. Try using the naproxen for headache first. If this does not work, try the Imitrex. It is advised that you follow-up with a PCP. If you do not have one, use the information provided in this documentation to find one.

## 2016-08-10 ENCOUNTER — Encounter (HOSPITAL_BASED_OUTPATIENT_CLINIC_OR_DEPARTMENT_OTHER): Payer: Self-pay | Admitting: Emergency Medicine

## 2016-12-28 ENCOUNTER — Emergency Department (HOSPITAL_BASED_OUTPATIENT_CLINIC_OR_DEPARTMENT_OTHER)
Admission: EM | Admit: 2016-12-28 | Discharge: 2016-12-28 | Disposition: A | Discharge status: Home / Self Care | Payer: BLUE CROSS/BLUE SHIELD | Attending: Emergency Medicine | Admitting: Emergency Medicine

## 2016-12-28 ENCOUNTER — Encounter (HOSPITAL_BASED_OUTPATIENT_CLINIC_OR_DEPARTMENT_OTHER): Payer: Self-pay | Admitting: *Deleted

## 2016-12-28 DIAGNOSIS — Y999 Unspecified external cause status: Secondary | ICD-10-CM | POA: Diagnosis not present

## 2016-12-28 DIAGNOSIS — H5712 Ocular pain, left eye: Secondary | ICD-10-CM | POA: Diagnosis not present

## 2016-12-28 DIAGNOSIS — Y929 Unspecified place or not applicable: Secondary | ICD-10-CM | POA: Insufficient documentation

## 2016-12-28 DIAGNOSIS — R51 Headache: Secondary | ICD-10-CM | POA: Diagnosis not present

## 2016-12-28 DIAGNOSIS — Y939 Activity, unspecified: Secondary | ICD-10-CM | POA: Diagnosis not present

## 2016-12-28 DIAGNOSIS — Z87891 Personal history of nicotine dependence: Secondary | ICD-10-CM | POA: Diagnosis not present

## 2016-12-28 DIAGNOSIS — S0592XA Unspecified injury of left eye and orbit, initial encounter: Secondary | ICD-10-CM | POA: Diagnosis present

## 2016-12-28 MED ORDER — TETRACAINE HCL 0.5 % OP SOLN
OPHTHALMIC | Status: AC
  Filled 2016-12-28: qty 4

## 2016-12-28 MED ORDER — ERYTHROMYCIN 5 MG/GM OP OINT: TOPICAL_OINTMENT | OPHTHALMIC | 0 refills | Status: DC

## 2016-12-28 MED ORDER — FLUORESCEIN SODIUM 0.6 MG OP STRP
ORAL_STRIP | OPHTHALMIC | Status: AC
  Filled 2016-12-28: qty 1

## 2016-12-28 MED FILL — ERYTHROMYCIN EYE OINTMENT: 5 | 14 days supply | Qty: 4 | Fill #0
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
## 2017-01-29 ENCOUNTER — Encounter (HOSPITAL_BASED_OUTPATIENT_CLINIC_OR_DEPARTMENT_OTHER): Payer: Self-pay | Admitting: Emergency Medicine

## 2017-01-29 ENCOUNTER — Emergency Department (HOSPITAL_BASED_OUTPATIENT_CLINIC_OR_DEPARTMENT_OTHER)
Admission: EM | Admit: 2017-01-29 | Discharge: 2017-01-30 | Disposition: A | Discharge status: Home / Self Care | Payer: BLUE CROSS/BLUE SHIELD | Attending: Emergency Medicine | Admitting: Emergency Medicine

## 2017-01-29 DIAGNOSIS — R1013 Epigastric pain: Secondary | ICD-10-CM | POA: Diagnosis present

## 2017-01-29 DIAGNOSIS — Z87891 Personal history of nicotine dependence: Secondary | ICD-10-CM | POA: Diagnosis not present

## 2017-01-29 DIAGNOSIS — Z79899 Other long term (current) drug therapy: Secondary | ICD-10-CM | POA: Insufficient documentation

## 2017-01-29 DIAGNOSIS — N760 Acute vaginitis: Secondary | ICD-10-CM | POA: Insufficient documentation

## 2017-01-29 DIAGNOSIS — B9689 Other specified bacterial agents as the cause of diseases classified elsewhere: Secondary | ICD-10-CM

## 2017-01-29 LAB — URINALYSIS, ROUTINE W REFLEX MICROSCOPIC
Bilirubin Urine: NEGATIVE
Glucose, UA: NEGATIVE mg/dL
HGB URINE DIPSTICK: NEGATIVE
Ketones, ur: NEGATIVE mg/dL
LEUKOCYTES UA: NEGATIVE
Nitrite: NEGATIVE
PROTEIN: 30 mg/dL — AB
Specific Gravity, Urine: 1.029 (ref 1.005–1.030)
pH: 6.5 (ref 5.0–8.0)

## 2017-01-29 LAB — URINALYSIS, MICROSCOPIC (REFLEX)

## 2017-01-29 LAB — PREGNANCY, URINE: PREG TEST UR: NEGATIVE
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
## 2017-01-29 NOTE — ED Triage Notes (Signed)
Abd pain x 2 weeks and dysuria today. Denies vag d/c or n/v

## 2017-01-29 NOTE — ED Notes (Signed)
Clean catch urine collected..

## 2017-01-30 LAB — WET PREP, GENITAL
Sperm: NONE SEEN
Trich, Wet Prep: NONE SEEN
YEAST WET PREP: NONE SEEN

## 2017-01-30 MED ORDER — METRONIDAZOLE 500 MG PO TABS: 500.0000 mg | ORAL_TABLET | Freq: Two times a day (BID) | ORAL | 0 refills | Status: AC

## 2017-01-30 MED ORDER — SUCRALFATE 1 G PO TABS
1.0000 g | ORAL_TABLET | Freq: Once | ORAL | Status: AC
  Administered 2017-01-30: 1 g via ORAL
  Filled 2017-01-30: qty 1

## 2017-01-30 MED ORDER — OMEPRAZOLE 20 MG PO CPDR: 20.0000 mg | DELAYED_RELEASE_CAPSULE | Freq: Every day | ORAL | 0 refills | Status: AC

## 2017-01-31 LAB — GC/CHLAMYDIA PROBE AMP (~~LOC~~) NOT AT ARMC
Chlamydia: NEGATIVE
NEISSERIA GONORRHEA: NEGATIVE

## 2017-08-15 ENCOUNTER — Encounter (HOSPITAL_BASED_OUTPATIENT_CLINIC_OR_DEPARTMENT_OTHER): Payer: Self-pay | Admitting: Emergency Medicine

## 2017-08-15 ENCOUNTER — Emergency Department (HOSPITAL_BASED_OUTPATIENT_CLINIC_OR_DEPARTMENT_OTHER)
Admission: EM | Admit: 2017-08-15 | Discharge: 2017-08-15 | Disposition: A | Discharge status: Home / Self Care | Payer: BLUE CROSS/BLUE SHIELD | Attending: Emergency Medicine | Admitting: Emergency Medicine

## 2017-08-15 DIAGNOSIS — Z87891 Personal history of nicotine dependence: Secondary | ICD-10-CM | POA: Insufficient documentation

## 2017-08-15 DIAGNOSIS — Z79899 Other long term (current) drug therapy: Secondary | ICD-10-CM | POA: Insufficient documentation

## 2017-08-15 DIAGNOSIS — R300 Dysuria: Secondary | ICD-10-CM | POA: Diagnosis present

## 2017-08-15 DIAGNOSIS — N3000 Acute cystitis without hematuria: Secondary | ICD-10-CM | POA: Diagnosis not present

## 2017-08-15 LAB — URINALYSIS, ROUTINE W REFLEX MICROSCOPIC
Bilirubin Urine: NEGATIVE
Glucose, UA: 100 mg/dL — AB
Ketones, ur: NEGATIVE mg/dL
Nitrite: POSITIVE — AB
Protein, ur: 100 mg/dL — AB
SPECIFIC GRAVITY, URINE: 1.015 (ref 1.005–1.030)
pH: 6 (ref 5.0–8.0)

## 2017-08-15 LAB — URINALYSIS, MICROSCOPIC (REFLEX)

## 2017-08-15 LAB — PREGNANCY, URINE: PREG TEST UR: NEGATIVE

## 2017-08-15 MED ORDER — PHENAZOPYRIDINE HCL 200 MG PO TABS: 200.0000 mg | ORAL_TABLET | Freq: Three times a day (TID) | ORAL | 0 refills | Status: AC | PRN

## 2017-08-15 MED ORDER — CEPHALEXIN 500 MG PO CAPS: 500.0000 mg | ORAL_CAPSULE | Freq: Four times a day (QID) | ORAL | 0 refills | Status: DC

## 2017-08-15 MED ORDER — FLUCONAZOLE 150 MG PO TABS: 150.0000 mg | ORAL_TABLET | Freq: Every day | ORAL | 1 refills | Status: AC

## 2017-08-15 NOTE — ED Triage Notes (Signed)
Pt reports dysuria, hematuria, urinary frequency & urgency x 1 week. Denies fever, n/v/d, or vaginal discharge.

## 2017-08-15 NOTE — ED Provider Notes (Signed)
[facility]   CSN: [id number] Arrival date & time: 08/15/17  8295     History   Chief Complaint Chief Complaint  Patient presents with  . Dysuria    HPI Leslie Gray is a 31 y.o. female.  Patient is a 31 year old female with history of prior UTIs presenting with complaints of burning with urination and hematuria. This is been ongoing for the past several days. She has been trying Azo at home with little relief. She denies any fevers or chills. She denies any vaginal discharge or new sexual partners. Her last menstrual period was last week and normal. His feels similar to her prior UTIs.      Past Medical History:  Diagnosis Date  . Migraine     There are no active problems to display for this patient.   History reviewed. No pertinent surgical history.  OB History    No data available       Home Medications    Prior to Admission medications   Medication Sig Start Date End Date Taking? Authorizing Provider  metroNIDAZOLE (FLAGYL) 500 MG tablet Take 1 tablet (500 mg total) by mouth 2 (two) times daily. One po bid x 7 days 01/30/17   Molpus, Jonny Ruiz, MD  Norgestim-Eth Estrad Triphasic [facility] TRI-CYCLEN, 28, PO) Take by mouth.    [provider]  omeprazole (PRILOSEC) 20 MG capsule Take 1 capsule (20 mg total) by mouth daily. 01/30/17   Molpus, Jonny Ruiz, MD  SUMAtriptan (IMITREX) 100 MG tablet Take 1 tablet (100 mg total) by mouth every 2 (two) hours as needed for migraine. May repeat in 2 hours if headache persists or recurs. 08/09/16   Joy, Hillard Danker, PA-C    Family History No family history on file.  Social History Social History  Substance Use Topics  . Smoking status: Former Games developer  . Smokeless tobacco: Never Used  . Alcohol use Yes     Comment: socially     Allergies   Patient has no known allergies.   Review of Systems Review of Systems  All other systems reviewed and are negative.    Physical Exam Updated Vital  Signs BP (!) 141/94 (BP Location: Right Arm)   Pulse 61   Temp 98.4 F (36.9 C) (Oral)   Resp 17   Ht  (1.753 m)   Wt 65.8 kg (145 lb)   LMP 08/09/2017 (Exact Date)   SpO2 100%   BMI 21.41 kg/m   Physical Exam  Constitutional: She is oriented to person, place, and time. She appears well-developed and well-nourished. No distress.  HENT:  Head: Normocephalic and atraumatic.  Neck: Normal range of motion. Neck supple.  Pulmonary/Chest: Effort normal.  Abdominal: Soft. Bowel sounds are normal. She exhibits no distension. There is no tenderness.  Musculoskeletal: Normal range of motion.  Neurological: She is alert and oriented to person, place, and time.  Skin: Skin is warm and dry. She is not diaphoretic.  Nursing note and vitals reviewed.    ED Treatments / Results  Labs (all labs ordered are listed, but only abnormal results are displayed) Labs Reviewed  URINALYSIS, ROUTINE W REFLEX MICROSCOPIC  PREGNANCY, URINE    EKG  EKG Interpretation None       Radiology No results found.  Procedures Procedures (including critical care time)  Medications Ordered in ED Medications - No data to display   Initial Impression / Assessment and Plan / ED Course  I have reviewed the triage vital signs  and the nursing notes.  Pertinent labs & imaging results that were available during my care of the patient were reviewed by me and considered in my medical decision making (see chart for details).  Symptoms and UA consistent with a UTI. She will be treated with Keflex and when necessary return.  Final Clinical Impressions(s) / ED Diagnoses   Final diagnoses:  None    New Prescriptions New Prescriptions   No medications on file     Geoffery Lyonselo, Aylee Littrell, MD 08/15/17 [phone]

## 2017-08-15 NOTE — Discharge Instructions (Signed)
Keflex as prescribed.  Pyridium as prescribed as needed for burning.  Return to the emergency department if you develop worsening symptoms, high fevers, severe abdominal pain, or other new and concerning symptoms.

## 2018-08-03 ENCOUNTER — Emergency Department (HOSPITAL_BASED_OUTPATIENT_CLINIC_OR_DEPARTMENT_OTHER)
Admission: EM | Admit: 2018-08-03 | Discharge: 2018-08-03 | Disposition: A | Discharge status: Home / Self Care | Payer: BC Managed Care – PPO | Attending: Emergency Medicine | Admitting: Emergency Medicine

## 2018-08-03 ENCOUNTER — Other Ambulatory Visit: Payer: Self-pay

## 2018-08-03 ENCOUNTER — Encounter (HOSPITAL_BASED_OUTPATIENT_CLINIC_OR_DEPARTMENT_OTHER): Payer: Self-pay | Admitting: Adult Health

## 2018-08-03 ENCOUNTER — Emergency Department (HOSPITAL_BASED_OUTPATIENT_CLINIC_OR_DEPARTMENT_OTHER): Payer: BC Managed Care – PPO

## 2018-08-03 DIAGNOSIS — Z87891 Personal history of nicotine dependence: Secondary | ICD-10-CM | POA: Insufficient documentation

## 2018-08-03 DIAGNOSIS — Z79899 Other long term (current) drug therapy: Secondary | ICD-10-CM | POA: Diagnosis not present

## 2018-08-03 DIAGNOSIS — N390 Urinary tract infection, site not specified: Secondary | ICD-10-CM | POA: Diagnosis not present

## 2018-08-03 DIAGNOSIS — I10 Essential (primary) hypertension: Secondary | ICD-10-CM | POA: Insufficient documentation

## 2018-08-03 DIAGNOSIS — M7918 Myalgia, other site: Secondary | ICD-10-CM | POA: Diagnosis present

## 2018-08-03 HISTORY — DX: Essential (primary) hypertension: I10

## 2018-08-03 LAB — CBC WITH DIFFERENTIAL/PLATELET
Basophils Absolute: 0 10*3/uL (ref 0.0–0.1)
Basophils Relative: 0 %
EOS PCT: 0 %
Eosinophils Absolute: 0 10*3/uL (ref 0.0–0.7)
HEMATOCRIT: 40.2 % (ref 36.0–46.0)
Hemoglobin: 13.9 g/dL (ref 12.0–15.0)
LYMPHS ABS: 0.5 10*3/uL — AB (ref 0.7–4.0)
LYMPHS PCT: 4 %
MCH: 31.7 pg (ref 26.0–34.0)
MCHC: 34.6 g/dL (ref 30.0–36.0)
MCV: 91.8 fL (ref 78.0–100.0)
MONO ABS: 0.8 10*3/uL (ref 0.1–1.0)
Monocytes Relative: 8 %
Neutro Abs: 9.3 10*3/uL — ABNORMAL HIGH (ref 1.7–7.7)
Neutrophils Relative %: 88 %
Platelets: 190 10*3/uL (ref 150–400)
RBC: 4.38 MIL/uL (ref 3.87–5.11)
RDW: 13.4 % (ref 11.5–15.5)
WBC: 10.6 10*3/uL — ABNORMAL HIGH (ref 4.0–10.5)

## 2018-08-03 LAB — URINALYSIS, MICROSCOPIC (REFLEX): WBC, UA: >50 WBC/hpf (ref 0–5)

## 2018-08-03 LAB — BASIC METABOLIC PANEL
Anion gap: 8 (ref 5–15)
BUN: 9 mg/dL (ref 6–20)
CHLORIDE: 105 mmol/L (ref 98–111)
CO2: 23 mmol/L (ref 22–32)
Calcium: 8.3 mg/dL — ABNORMAL LOW (ref 8.9–10.3)
Creatinine, Ser: 0.73 mg/dL (ref 0.44–1.00)
GFR calc Af Amer: >60 mL/min (ref >60)
GFR calc non Af Amer: >60 mL/min (ref >60)
GLUCOSE: 110 mg/dL — AB (ref 70–99)
Potassium: 3.4 mmol/L — ABNORMAL LOW (ref 3.5–5.1)
Sodium: 136 mmol/L (ref 135–145)

## 2018-08-03 LAB — URINALYSIS, ROUTINE W REFLEX MICROSCOPIC
BILIRUBIN URINE: NEGATIVE
Glucose, UA: NEGATIVE mg/dL
Ketones, ur: 15 mg/dL — AB
Nitrite: POSITIVE — AB
Protein, ur: 100 mg/dL — AB
Specific Gravity, Urine: 1.025 (ref 1.005–1.030)
pH: 6 (ref 5.0–8.0)

## 2018-08-03 LAB — PREGNANCY, URINE: Preg Test, Ur: NEGATIVE

## 2018-08-03 MED ORDER — CEPHALEXIN 500 MG PO CAPS: 500.0000 mg | ORAL_CAPSULE | Freq: Four times a day (QID) | ORAL | 0 refills | Status: DC

## 2018-08-03 MED ORDER — KETOROLAC TROMETHAMINE 30 MG/ML IJ SOLN
30.0000 mg | Freq: Once | INTRAMUSCULAR | Status: DC
  Filled 2018-08-03: qty 1

## 2018-08-03 MED ORDER — KETOROLAC TROMETHAMINE 30 MG/ML IJ SOLN
30.0000 mg | Freq: Once | INTRAMUSCULAR | Status: AC
  Administered 2018-08-03: 30 mg via INTRAVENOUS

## 2018-08-03 MED ORDER — KETOROLAC TROMETHAMINE 30 MG/ML IJ SOLN: 30.0000 mg | Freq: Once | INTRAMUSCULAR | Status: DC

## 2018-08-03 MED ORDER — SODIUM CHLORIDE 0.9 % IV BOLUS
1000.0000 mL | Freq: Once | INTRAVENOUS | Status: DC
Start: 2018-08-03 — End: 2018-08-03

## 2018-08-03 MED ORDER — SODIUM CHLORIDE 0.9 % IV BOLUS
1000.0000 mL | Freq: Once | INTRAVENOUS | Status: AC
  Administered 2018-08-03: 1000 mL via INTRAVENOUS

## 2018-08-03 MED FILL — CEPHALEXIN 500 MG CAPSULE: 500 | 7 days supply | Qty: 28 | Fill #0

## 2018-08-03 NOTE — ED Provider Notes (Signed)
[facility] EMERGENCY DEPARTMENT Provider Note   CSN: [id number] Arrival date & time: 08/03/18  4098     History   Chief Complaint Chief Complaint  Patient presents with  . Generalized Body Aches    HPI Leslie Gray is a 32 y.o. female.  HPI Patient has felt bad for the last 3 to 4 days.  Body aches and chills.  Has had nausea.  Also dull chest pain.  States her muscles hurt all over.  May have had some mild dysuria.  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  States she was not ready for it.  Does not know of any illnesses going through her class.  States she feels fatigued. Past Medical History:  Diagnosis Date  . Hypertension   . Migraine     There are no active problems to display for this patient.   History reviewed. No pertinent surgical history.   OB History   None      Home Medications    Prior to Admission medications   Medication Sig Start Date End Date Taking? Authorizing Provider  cephALEXin (KEFLEX) 500 MG capsule Take 1 capsule (500 mg total) by mouth 4 (four) times daily. 08/03/18   Benjiman Core, MD  fluconazole (DIFLUCAN) 150 MG tablet Take 1 tablet (150 mg total) by mouth daily. 08/15/17   Geoffery Lyons, MD  metroNIDAZOLE (FLAGYL) 500 MG tablet Take 1 tablet (500 mg total) by mouth 2 (two) times daily. One po bid x 7 days 01/30/17   Molpus, Jonny Ruiz, MD  Norgestim-Eth Estrad Triphasic [facility] TRI-CYCLEN, 28, PO) Take by mouth.    [provider]  omeprazole (PRILOSEC) 20 MG capsule Take 1 capsule (20 mg total) by mouth daily. 01/30/17   Molpus, John, MD  phenazopyridine (PYRIDIUM) 200 MG tablet Take 1 tablet (200 mg total) by mouth 3 (three) times daily as needed for pain. 08/15/17   Geoffery Lyons, MD  SUMAtriptan (IMITREX) 100 MG tablet Take 1 tablet (100 mg total) by mouth every 2 (two) hours as needed for migraine. May repeat in 2 hours if headache persists or recurs. 08/09/16   Joy, Hillard Danker, PA-C     Family History History reviewed. No pertinent family history.  Social History Social History   Tobacco Use  . Smoking status: Former Games developer  . Smokeless tobacco: Never Used  Substance Use Topics  . Alcohol use: Yes    Comment: socially  . Drug use: No     Allergies   Patient has no known allergies.   Review of Systems Review of Systems  Constitutional: Positive for appetite change, chills and fatigue. Negative for fever.  HENT: Negative for sore throat.   Respiratory: Positive for shortness of breath. Negative for choking.   Cardiovascular: Negative for chest pain.  Gastrointestinal: Positive for nausea. Negative for abdominal pain.  Endocrine: Negative for polyuria.  Genitourinary: Negative for dysuria.  Musculoskeletal: Positive for back pain and myalgias.  Skin: Negative for rash.  Neurological: Positive for weakness.  Psychiatric/Behavioral: Negative for confusion.     Physical Exam Updated Vital Signs BP 131/80   Pulse 99   Temp 98.7 F (37.1 C) (Oral)   Resp 18   Wt 65.8 kg   LMP 07/03/2018 (Approximate)   SpO2 100%   BMI 21.42 kg/m   Physical Exam  Constitutional: She appears well-developed.  HENT:  Head: Normocephalic.  Eyes: EOM are normal.  Neck: Neck supple.  Cardiovascular:  Mild tachycardia  Pulmonary/Chest:  Effort normal. She has no wheezes. She has no rales.  Abdominal: Soft. There is no tenderness.  Genitourinary:  Genitourinary Comments: No CVA tenderness  Musculoskeletal: She exhibits no edema.  Neurological: She is alert.  Skin: Skin is warm. Capillary refill takes less than 2 seconds.  Psychiatric:  Patient is somewhat tearful     ED Treatments / Results  Labs (all labs ordered are listed, but only abnormal results are displayed) Labs Reviewed  URINALYSIS, ROUTINE W REFLEX MICROSCOPIC - Abnormal; Notable for the following components:      Result Value   Hgb urine dipstick SMALL (*)    Ketones, ur 15 (*)    Protein,  ur 100 (*)    Nitrite POSITIVE (*)    Leukocytes, UA MODERATE (*)    All other components within normal limits  BASIC METABOLIC PANEL - Abnormal; Notable for the following components:   Potassium 3.4 (*)    Glucose, Bld 110 (*)    Calcium 8.3 (*)    All other components within normal limits  CBC WITH DIFFERENTIAL/PLATELET - Abnormal; Notable for the following components:   WBC 10.6 (*)    Neutro Abs 9.3 (*)    Lymphs Abs 0.5 (*)    All other components within normal limits  URINALYSIS, MICROSCOPIC (REFLEX) - Abnormal; Notable for the following components:   Bacteria, UA MANY (*)    All other components within normal limits  URINE CULTURE  PREGNANCY, URINE    EKG None  Radiology Dg Chest 2 View  Result Date: 08/03/2018 CLINICAL DATA:  Body aches and chills for 4 days. EXAM: CHEST - 2 VIEW COMPARISON:  None. FINDINGS: The lungs are clear. Heart size is normal. No pneumothorax or pleural effusion. No acute or focal bony abnormality. IMPRESSION: Normal chest. Electronically Signed   By: Drusilla Kannerhomas  Dalessio M.D.   On: 08/03/2018 10:29    Procedures Procedures (including critical care time)  Medications Ordered in ED Medications  ketorolac (TORADOL) 30 MG/ML injection 30 mg (30 mg Intravenous Given 08/03/18 1040)  sodium chloride 0.9 % bolus 1,000 mL (0 mLs Intravenous Stopped 08/03/18 1224)     Initial Impression / Assessment and Plan / ED Course  I have reviewed the triage vital signs and the nursing notes.  Pertinent labs & imaging results that were available during my care of the patient were reviewed by me and considered in my medical decision making (see chart for details).     Patient with myalgias and feeling bad.  Lab work reassuring but does appear to have a urinary tract infection.  Culture sent.  Will start on antibiotics and discharge home.  Doubt a severe sepsis at this time.  Final Clinical Impressions(s) / ED Diagnoses   Final diagnoses:  Lower urinary tract  infectious disease    ED Discharge Orders         Ordered    cephALEXin (KEFLEX) 500 MG capsule  4 times daily     08/03/18 1243           Benjiman CorePickering, Sarely Stracener, MD 08/03/18 1249

## 2018-08-03 NOTE — ED Triage Notes (Signed)
Presents with bodyaches and chills that began Monday. Today she began to have nausea and upset stomach and feels like it is hard to take a deep breath. She denies cough, vomting and diarrhea. Denies fevers. Endorses runny nose.

## 2018-08-05 ENCOUNTER — Other Ambulatory Visit: Payer: Self-pay

## 2018-08-05 ENCOUNTER — Emergency Department (HOSPITAL_BASED_OUTPATIENT_CLINIC_OR_DEPARTMENT_OTHER)
Admission: EM | Admit: 2018-08-05 | Discharge: 2018-08-05 | Disposition: A | Discharge status: Home / Self Care | Payer: BC Managed Care – PPO | Attending: Emergency Medicine | Admitting: Emergency Medicine

## 2018-08-05 ENCOUNTER — Encounter (HOSPITAL_BASED_OUTPATIENT_CLINIC_OR_DEPARTMENT_OTHER): Payer: Self-pay | Admitting: *Deleted

## 2018-08-05 DIAGNOSIS — I10 Essential (primary) hypertension: Secondary | ICD-10-CM | POA: Diagnosis not present

## 2018-08-05 DIAGNOSIS — N10 Acute tubulo-interstitial nephritis: Secondary | ICD-10-CM | POA: Insufficient documentation

## 2018-08-05 DIAGNOSIS — N12 Tubulo-interstitial nephritis, not specified as acute or chronic: Secondary | ICD-10-CM

## 2018-08-05 DIAGNOSIS — Z87891 Personal history of nicotine dependence: Secondary | ICD-10-CM | POA: Insufficient documentation

## 2018-08-05 DIAGNOSIS — R112 Nausea with vomiting, unspecified: Secondary | ICD-10-CM | POA: Diagnosis present

## 2018-08-05 LAB — CBC WITH DIFFERENTIAL/PLATELET
Basophils Absolute: 0 10*3/uL (ref 0.0–0.1)
Basophils Relative: 0 %
Eosinophils Absolute: 0 10*3/uL (ref 0.0–0.7)
Eosinophils Relative: 0 %
HCT: 37.3 % (ref 36.0–46.0)
Hemoglobin: 13 g/dL (ref 12.0–15.0)
Lymphocytes Relative: 9 %
Lymphs Abs: 0.9 10*3/uL (ref 0.7–4.0)
MCH: 31.3 pg (ref 26.0–34.0)
MCHC: 34.9 g/dL (ref 30.0–36.0)
MCV: 89.9 fL (ref 78.0–100.0)
Monocytes Absolute: 0.9 10*3/uL (ref 0.1–1.0)
Monocytes Relative: 8 %
Neutro Abs: 8.6 10*3/uL — ABNORMAL HIGH (ref 1.7–7.7)
Neutrophils Relative %: 83 %
Platelets: 208 10*3/uL (ref 150–400)
RBC: 4.15 MIL/uL (ref 3.87–5.11)
RDW: 13.1 % (ref 11.5–15.5)
WBC: 10.4 10*3/uL (ref 4.0–10.5)

## 2018-08-05 LAB — URINALYSIS, MICROSCOPIC (REFLEX): RBC / HPF: >50 RBC/hpf (ref 0–5)

## 2018-08-05 LAB — COMPREHENSIVE METABOLIC PANEL
ALT: 14 U/L (ref 0–44)
AST: 16 U/L (ref 15–41)
Albumin: 3.2 g/dL — ABNORMAL LOW (ref 3.5–5.0)
Alkaline Phosphatase: 80 U/L (ref 38–126)
Anion gap: 11 (ref 5–15)
BUN: 9 mg/dL (ref 6–20)
CO2: 24 mmol/L (ref 22–32)
Calcium: 8.1 mg/dL — ABNORMAL LOW (ref 8.9–10.3)
Chloride: 105 mmol/L (ref 98–111)
Creatinine, Ser: 0.57 mg/dL (ref 0.44–1.00)
GFR calc Af Amer: >60 mL/min (ref >60)
GFR calc non Af Amer: >60 mL/min (ref >60)
Glucose, Bld: 111 mg/dL — ABNORMAL HIGH (ref 70–99)
Potassium: 2.7 mmol/L — CL (ref 3.5–5.1)
Sodium: 140 mmol/L (ref 135–145)
Total Bilirubin: 0.6 mg/dL (ref 0.3–1.2)
Total Protein: 7 g/dL (ref 6.5–8.1)

## 2018-08-05 LAB — URINALYSIS, ROUTINE W REFLEX MICROSCOPIC
BILIRUBIN URINE: NEGATIVE
GLUCOSE, UA: NEGATIVE mg/dL
Ketones, ur: 15 mg/dL — AB
NITRITE: NEGATIVE
PH: 5.5 (ref 5.0–8.0)
PROTEIN: 100 mg/dL — AB
Specific Gravity, Urine: 1.02 (ref 1.005–1.030)

## 2018-08-05 LAB — URINE CULTURE

## 2018-08-05 LAB — MAGNESIUM: Magnesium: 2.3 mg/dL (ref 1.7–2.4)

## 2018-08-05 LAB — PREGNANCY, URINE: Preg Test, Ur: NEGATIVE

## 2018-08-05 LAB — LIPASE, BLOOD: Lipase: 23 U/L (ref 11–51)

## 2018-08-05 MED ORDER — CEPHALEXIN 500 MG PO CAPS: 500.0000 mg | ORAL_CAPSULE | Freq: Four times a day (QID) | ORAL | 0 refills | Status: DC

## 2018-08-05 MED ORDER — POTASSIUM CHLORIDE 10 MEQ/100ML IV SOLN
10.0000 meq | INTRAVENOUS | Status: AC
  Administered 2018-08-05 (×2): 10 meq via INTRAVENOUS
  Filled 2018-08-05 (×2): qty 100

## 2018-08-05 MED ORDER — ONDANSETRON HCL 4 MG/2ML IJ SOLN
4.0000 mg | Freq: Once | INTRAMUSCULAR | Status: AC
  Administered 2018-08-05: 4 mg via INTRAVENOUS
  Filled 2018-08-05: qty 2

## 2018-08-05 MED ORDER — SODIUM CHLORIDE 0.9 % IV BOLUS
1000.0000 mL | Freq: Once | INTRAVENOUS | Status: AC
  Administered 2018-08-05: 1000 mL via INTRAVENOUS

## 2018-08-05 MED ORDER — SODIUM CHLORIDE 0.9 % IV SOLN
1.0000 g | Freq: Once | INTRAVENOUS | Status: AC
  Administered 2018-08-05: 1 g via INTRAVENOUS
  Filled 2018-08-05: qty 10

## 2018-08-05 MED ORDER — POTASSIUM CHLORIDE CRYS ER 20 MEQ PO TBCR
60.0000 meq | EXTENDED_RELEASE_TABLET | Freq: Once | ORAL | Status: AC
  Administered 2018-08-05: 60 meq via ORAL
  Filled 2018-08-05: qty 3

## 2018-08-05 MED ORDER — ONDANSETRON HCL 4 MG PO TABS: 4.0000 mg | ORAL_TABLET | Freq: Four times a day (QID) | ORAL | 0 refills | Status: AC

## 2018-08-05 MED ORDER — FAMOTIDINE IN NACL 20-0.9 MG/50ML-% IV SOLN
20.0000 mg | Freq: Once | INTRAVENOUS | Status: AC
  Administered 2018-08-05: 20 mg via INTRAVENOUS
  Filled 2018-08-05: qty 50

## 2018-08-05 MED ORDER — FAMOTIDINE 20 MG PO TABS: 20.0000 mg | ORAL_TABLET | Freq: Two times a day (BID) | ORAL | 0 refills | Status: AC

## 2018-08-05 MED ORDER — KETOROLAC TROMETHAMINE 30 MG/ML IJ SOLN
30.0000 mg | Freq: Once | INTRAMUSCULAR | Status: AC
  Administered 2018-08-05: 30 mg via INTRAVENOUS
  Filled 2018-08-05: qty 1

## 2018-08-05 NOTE — Discharge Instructions (Signed)
Complete the Keflex you have and the Keflex I am prescribing you.  Take Pepcid twice daily to help prevent stomach upset.  Take Zofran every 6 hours as needed for nausea or vomiting.  Make sure to stay well-hydrated.  Please return to the emergency department if you develop any new or worsening symptoms including persistent fever over 100.4, intractable vomiting, severe worsening pain, or any other concerning symptom.

## 2018-08-05 NOTE — ED Notes (Signed)
Pt discharged to home with family. NAD.  

## 2018-08-05 NOTE — ED Notes (Signed)
Seen here Thurs. Placed on meds for UTI ...  Now c/o headache and constipation.  No appetite. Pt. Also c/o nausea and vomiting liquids.

## 2018-08-05 NOTE — ED Notes (Signed)
K+ 2.7, results given to ED PA and primary RN

## 2018-08-05 NOTE — ED Notes (Signed)
Patient is tolerating PO fluids, and meal.

## 2018-08-05 NOTE — ED Provider Notes (Signed)
[facility] EMERGENCY DEPARTMENT Provider Note   CSN: [id number] Arrival date & time: 08/05/18  1118     History   Chief Complaint Chief Complaint  Patient presents with  . Abdominal Pain    HPI Leslie Gray is a 32 y.o. female with history of migraine, hypertension who presents with a few day history of nausea, vomiting, and upper abdominal pain.  Patient was seen 3 days ago and diagnosed with UTI.  At that time she had generalized body aches and back pain.  She continues to have left-sided flank pain as well.  She has been taking Keflex as prescribed.  She is only been able to tolerate clear fluids.  She denies any urinary symptoms anymore.  She denies any abnormal vaginal bleeding or discharge.  She denies any fevers at home.  She continues to have body aches.  HPI  Past Medical History:  Diagnosis Date  . Hypertension   . Migraine     There are no active problems to display for this patient.   History reviewed. No pertinent surgical history.   OB History   None      Home Medications    Prior to Admission medications   Medication Sig Start Date End Date Taking? Authorizing Provider  cephALEXin (KEFLEX) 500 MG capsule Take 1 capsule (500 mg total) by mouth 4 (four) times daily. 08/05/18   Berklee Battey, Waylan Boga, PA-C  famotidine (PEPCID) 20 MG tablet Take 1 tablet (20 mg total) by mouth 2 (two) times daily. 08/05/18   Kelena Garrow, Waylan Boga, PA-C  fluconazole (DIFLUCAN) 150 MG tablet Take 1 tablet (150 mg total) by mouth daily. 08/15/17   Geoffery Lyons, MD  metroNIDAZOLE (FLAGYL) 500 MG tablet Take 1 tablet (500 mg total) by mouth 2 (two) times daily. One po bid x 7 days 01/30/17   Molpus, Jonny Ruiz, MD  Norgestim-Eth Estrad Triphasic [facility] TRI-CYCLEN, 28, PO) Take by mouth.    [provider]  omeprazole (PRILOSEC) 20 MG capsule Take 1 capsule (20 mg total) by mouth daily. 01/30/17   Molpus, John, MD  ondansetron (ZOFRAN) 4 MG tablet Take 1 tablet (4 mg total) by  mouth every 6 (six) hours. 08/05/18   Malon Siddall, Waylan Boga, PA-C  phenazopyridine (PYRIDIUM) 200 MG tablet Take 1 tablet (200 mg total) by mouth 3 (three) times daily as needed for pain. 08/15/17   Geoffery Lyons, MD  SUMAtriptan (IMITREX) 100 MG tablet Take 1 tablet (100 mg total) by mouth every 2 (two) hours as needed for migraine. May repeat in 2 hours if headache persists or recurs. 08/09/16   Joy, Hillard Danker, PA-C    Family History History reviewed. No pertinent family history.  Social History Social History   Tobacco Use  . Smoking status: Former Games developer  . Smokeless tobacco: Never Used  Substance Use Topics  . Alcohol use: Yes    Comment: socially  . Drug use: No     Allergies   Patient has no known allergies.   Review of Systems Review of Systems  Constitutional: Negative for chills and fever.  HENT: Negative for facial swelling and sore throat.   Respiratory: Negative for shortness of breath.   Cardiovascular: Negative for chest pain.  Gastrointestinal: Positive for abdominal pain, nausea and vomiting. Negative for diarrhea.  Genitourinary: Positive for flank pain. Negative for dysuria.  Musculoskeletal: Positive for myalgias and neck pain. Negative for back pain.  Skin: Negative for rash and wound.  Neurological: Positive for headaches.  Psychiatric/Behavioral: The  patient is not nervous/anxious.      Physical Exam Updated Vital Signs BP 112/74 (BP Location: Right Arm)   Pulse 88   Temp 98.5 F (36.9 C) (Oral)   Resp (!) 23   Ht 5\' 9"  (1.753 m)   Wt 65.8 kg   LMP 07/27/2018   SpO2 100%   BMI 21.42 kg/m   Physical Exam  Constitutional: She appears well-developed and well-nourished. No distress.  HENT:  Head: Normocephalic and atraumatic.  Mouth/Throat: Oropharynx is clear and moist. No oropharyngeal exudate.  Eyes: Pupils are equal, round, and reactive to light. Conjunctivae are normal. Right eye exhibits no discharge. Left eye exhibits no discharge. No scleral  icterus.  Neck: Normal range of motion. Neck supple. Muscular tenderness present. No thyromegaly present.    Cardiovascular: Normal rate, regular rhythm, normal heart sounds and intact distal pulses. Exam reveals no gallop and no friction rub.  No murmur heard. Pulmonary/Chest: Effort normal and breath sounds normal. No stridor. No respiratory distress. She has no wheezes. She has no rales.  Abdominal: Soft. Bowel sounds are normal. She exhibits no distension. There is tenderness in the epigastric area. There is CVA tenderness. There is no rebound (Left) and no guarding.  Musculoskeletal: She exhibits no edema.  Lymphadenopathy:    She has no cervical adenopathy.  Neurological: She is alert. Coordination normal.  Skin: Skin is warm and dry. No rash noted. She is not diaphoretic. No pallor.  Psychiatric: She has a normal mood and affect.  Nursing note and vitals reviewed.    ED Treatments / Results  Labs (all labs ordered are listed, but only abnormal results are displayed) Labs Reviewed  URINALYSIS, ROUTINE W REFLEX MICROSCOPIC - Abnormal; Notable for the following components:      Result Value   APPearance HAZY (*)    Hgb urine dipstick LARGE (*)    Ketones, ur 15 (*)    Protein, ur 100 (*)    Leukocytes, UA TRACE (*)    All other components within normal limits  COMPREHENSIVE METABOLIC PANEL - Abnormal; Notable for the following components:   Potassium 2.7 (*)    Glucose, Bld 111 (*)    Calcium 8.1 (*)    Albumin 3.2 (*)    All other components within normal limits  CBC WITH DIFFERENTIAL/PLATELET - Abnormal; Notable for the following components:   Neutro Abs 8.6 (*)    All other components within normal limits  URINALYSIS, MICROSCOPIC (REFLEX) - Abnormal; Notable for the following components:   Bacteria, UA MANY (*)    All other components within normal limits  PREGNANCY, URINE  LIPASE, BLOOD  MAGNESIUM    EKG None  Radiology No results  found.  Procedures Procedures (including critical care time)  Medications Ordered in ED Medications  sodium chloride 0.9 % bolus 1,000 mL (0 mLs Intravenous Stopped 08/05/18 1747)  ondansetron (ZOFRAN) injection 4 mg (4 mg Intravenous Given 08/05/18 1257)  ketorolac (TORADOL) 30 MG/ML injection 30 mg (30 mg Intravenous Given 08/05/18 1257)  famotidine (PEPCID) IVPB 20 mg premix (0 mg Intravenous Stopped 08/05/18 1337)  potassium chloride 10 mEq in 100 mL IVPB (0 mEq Intravenous Stopped 08/05/18 1747)  cefTRIAXone (ROCEPHIN) 1 g in sodium chloride 0.9 % 100 mL IVPB ( Intravenous Stopped 08/05/18 1549)  potassium chloride SA (K-DUR,KLOR-CON) CR tablet 60 mEq (60 mEq Oral Given 08/05/18 1459)     Initial Impression / Assessment and Plan / ED Course  I have reviewed the triage vital signs and  the nursing notes.  Pertinent labs & imaging results that were available during my care of the patient were reviewed by me and considered in my medical decision making (see chart for details).     Patient with suspected pyelonephritis. She was also found to have hypokalemia, 2.7, today which was replaced in the ED. No leukocytosis. UA continues to show signs of UTI, however improved from last visit. Patient with no abdominal tenderness or pain after Toradol, Pepcid, Zofran. She was given IV fluids as well. She is tolerating microwave dinner in the ED.  Iv Rocephin given in the ED. Urine culture returned with good sensitivity so will continue Keflex for 14 days total. Will discharge home with more Keflex, Zofran, Pepcid. Strict return precautions discussed. Patient understands and agrees with plan. Patient vitals stable throughout ED course and discharged in satisfactory condition. I discussed patient case with Dr. Lynelle DoctorKnapp who guided the patient's management and agrees with plan.   Final Clinical Impressions(s) / ED Diagnoses   Final diagnoses:  Pyelonephritis    ED Discharge Orders         Ordered     cephALEXin (KEFLEX) 500 MG capsule  4 times daily     08/05/18 1729    ondansetron (ZOFRAN) 4 MG tablet  Every 6 hours     08/05/18 1729    famotidine (PEPCID) 20 MG tablet  2 times daily     08/05/18 1729           Emi HolesLaw, [address] M, PA-C 08/05/18 1840    Linwood DibblesKnapp, Jon, MD 08/06/18 0830

## 2018-08-05 NOTE — ED Triage Notes (Signed)
Pt see here 3 days ago DX UTI , c/o no improvement , now with h/a n/v

## 2018-08-06 ENCOUNTER — Telehealth: Payer: Self-pay

## 2018-08-06 NOTE — Telephone Encounter (Signed)
Post ED Visit - Positive Culture Follow-up  Culture report reviewed by antimicrobial stewardship pharmacist:  []  Enzo Bi, Pharm.D. []  Celedonio Miyamoto, Pharm.D., BCPS AQ-ID []  Garvin Fila, Pharm.D., BCPS []  Georgina Pillion, [address].D., BCPS []  [place], [address].D., BCPS, AAHIVP []  Estella Husk, Pharm.D., BCPS, AAHIVP [x]  Lysle Pearl, PharmD, BCPS []  Phillips Climes, PharmD, BCPS []  Agap[employer] Games, PharmD, BCPS []  Verlan Friends, PharmD  Positive urine culture Treated with Cephalexin, organism sensitive to the same and no further patient follow-up is required at this time.  Jerry Caras 08/06/2018, 9:23 AM

## 2019-01-28 ENCOUNTER — Emergency Department (HOSPITAL_BASED_OUTPATIENT_CLINIC_OR_DEPARTMENT_OTHER): Payer: BC Managed Care – PPO

## 2019-01-28 ENCOUNTER — Emergency Department (HOSPITAL_BASED_OUTPATIENT_CLINIC_OR_DEPARTMENT_OTHER)
Admission: EM | Admit: 2019-01-28 | Discharge: 2019-01-28 | Disposition: A | Discharge status: Home / Self Care | Payer: BC Managed Care – PPO | Attending: Emergency Medicine | Admitting: Emergency Medicine

## 2019-01-28 ENCOUNTER — Other Ambulatory Visit: Payer: Self-pay

## 2019-01-28 ENCOUNTER — Encounter (HOSPITAL_BASED_OUTPATIENT_CLINIC_OR_DEPARTMENT_OTHER): Payer: Self-pay | Admitting: Emergency Medicine

## 2019-01-28 DIAGNOSIS — I10 Essential (primary) hypertension: Secondary | ICD-10-CM | POA: Diagnosis not present

## 2019-01-28 DIAGNOSIS — R0789 Other chest pain: Secondary | ICD-10-CM | POA: Diagnosis present

## 2019-01-28 DIAGNOSIS — Z79899 Other long term (current) drug therapy: Secondary | ICD-10-CM | POA: Insufficient documentation

## 2019-01-28 DIAGNOSIS — Z87891 Personal history of nicotine dependence: Secondary | ICD-10-CM | POA: Insufficient documentation

## 2019-01-28 DIAGNOSIS — R079 Chest pain, unspecified: Secondary | ICD-10-CM | POA: Diagnosis not present

## 2019-01-28 MED ORDER — SODIUM CHLORIDE 0.9% FLUSH
3.0000 mL | Freq: Once | INTRAVENOUS | Status: DC
  Filled 2019-01-28: qty 3

## 2019-01-28 NOTE — ED Triage Notes (Signed)
Intermittent L side chest pain x 2 days. Denies SOB

## 2019-01-28 NOTE — ED Notes (Signed)
Patient transported to X-ray 

## 2019-01-28 NOTE — ED Provider Notes (Signed)
[facility] EMERGENCY DEPARTMENT Provider Note   CSN: [id number] Arrival date & time: 01/28/19  8469    History   Chief Complaint Chief Complaint  Patient presents with  . Chest Pain    HPI Leslie Gray is a 33 y.o. female.     HPI Patient presents with left-sided chest pain.  Left lateral lower chest wall.  Comes and goes.  Last around 2 to 3 minutes when it comes on.  No fevers.  No shortness of breath.  No cough.  No abdominal pain.  No swelling in her legs.  She does not smoke. Past Medical History:  Diagnosis Date  . Hypertension   . Migraine     There are no active problems to display for this patient.   History reviewed. No pertinent surgical history.   OB History   No obstetric history on file.      Home Medications    Prior to Admission medications   Medication Sig Start Date End Date Taking? Authorizing Provider  hydrochlorothiazide (HYDRODIURIL) 25 MG tablet Take 25 mg by mouth daily.   Yes [provider]  cephALEXin (KEFLEX) 500 MG capsule Take 1 capsule (500 mg total) by mouth 4 (four) times daily. 08/05/18   Law, Waylan Boga, PA-C  famotidine (PEPCID) 20 MG tablet Take 1 tablet (20 mg total) by mouth 2 (two) times daily. 08/05/18   Law, Waylan Boga, PA-C  fluconazole (DIFLUCAN) 150 MG tablet Take 1 tablet (150 mg total) by mouth daily. 08/15/17   Geoffery Lyons, MD  metroNIDAZOLE (FLAGYL) 500 MG tablet Take 1 tablet (500 mg total) by mouth 2 (two) times daily. One po bid x 7 days 01/30/17   Molpus, Jonny Ruiz, MD  Norgestim-Eth Estrad Triphasic [facility] TRI-CYCLEN, 28, PO) Take by mouth.    [provider]  omeprazole (PRILOSEC) 20 MG capsule Take 1 capsule (20 mg total) by mouth daily. 01/30/17   Molpus, John, MD  ondansetron (ZOFRAN) 4 MG tablet Take 1 tablet (4 mg total) by mouth every 6 (six) hours. 08/05/18   Law, Waylan Boga, PA-C  phenazopyridine (PYRIDIUM) 200 MG tablet Take 1 tablet (200 mg total) by mouth 3 (three) times  daily as needed for pain. 08/15/17   Geoffery Lyons, MD  SUMAtriptan (IMITREX) 100 MG tablet Take 1 tablet (100 mg total) by mouth every 2 (two) hours as needed for migraine. May repeat in 2 hours if headache persists or recurs. 08/09/16   Joy, Hillard Danker, PA-C    Family History No family history on file.  Social History Social History   Tobacco Use  . Smoking status: Former Games developer  . Smokeless tobacco: Never Used  Substance Use Topics  . Alcohol use: Yes    Comment: socially  . Drug use: No     Allergies   Patient has no known allergies.   Review of Systems Review of Systems  Constitutional: Negative for appetite change.  HENT: Negative for congestion.   Respiratory: Negative for shortness of breath.   Cardiovascular: Positive for chest pain.  Gastrointestinal: Negative for abdominal pain.  Genitourinary: Negative for flank pain.  Musculoskeletal: Negative for back pain.  Skin: Negative for rash.  Neurological: Negative for weakness.  Psychiatric/Behavioral: Negative for confusion.     Physical Exam Updated Vital Signs BP (!) 154/98 (BP Location: Right Arm)   Pulse 62   Temp 99.3 F (37.4 C) (Oral)   Resp 16   Ht 5\' 9"  (1.753 m)   Wt 68 kg  LMP 01/22/2019   SpO2 99%   BMI 22.15 kg/m   Physical Exam Vitals signs and nursing note reviewed.  HENT:     Head: Normocephalic.  Eyes:     Pupils: Pupils are equal, round, and reactive to light.  Neck:     Musculoskeletal: Neck supple.  Cardiovascular:     Rate and Rhythm: Normal rate.     Heart sounds: No friction rub.  Chest:     Chest wall: Tenderness present.     Comments: Tenderness to left lateral lower chest wall.  No rash.  No crepitance or deformity. Musculoskeletal:     Right lower leg: She exhibits no tenderness. No edema.     Left lower leg: She exhibits no tenderness. No edema.  Skin:    General: Skin is warm.     Capillary Refill: Capillary refill takes less than 2 seconds.  Neurological:      General: No focal deficit present.     Mental Status: She is alert.      ED Treatments / Results  Labs (all labs ordered are listed, but only abnormal results are displayed) Labs Reviewed - No data to display  EKG EKG Interpretation  Date/Time:  Sunday January 28 2019 09:36:06 EST Ventricular Rate:  62 PR Interval:    QRS Duration: 93 QT Interval:  388 QTC Calculation: 394 R Axis:   78 Text Interpretation:  Sinus rhythm Baseline wander in lead(s) II aVF V5 V6 Confirmed by Benjiman Core [phone]) on 01/28/2019 10:50:05 AM   Radiology Dg Chest 2 View  Result Date: 01/28/2019 CLINICAL DATA:  Left chest pain and cough for 2 days. EXAM: CHEST - 2 VIEW COMPARISON:  PA and lateral chest 08/03/2018. FINDINGS: Lungs clear. Heart size normal. No pneumothorax or pleural fluid. No bony abnormality. IMPRESSION: Normal chest. Electronically Signed   By: Drusilla Kanner M.D.   On: 01/28/2019 10:24    Procedures Procedures (including critical care time)  Medications Ordered in ED Medications - No data to display   Initial Impression / Assessment and Plan / ED Course  I have reviewed the triage vital signs and the nursing notes.  Pertinent labs & imaging results that were available during my care of the patient were reviewed by me and considered in my medical decision making (see chart for details).        Patient with chest pain.  Left lower.  Has had the last couple days.  Thinks it could be related to wine she had drank.  No abdominal pain.  Work-up reassuring.  Discharge home.  Final Clinical Impressions(s) / ED Diagnoses   Final diagnoses:  Nonspecific chest pain    ED Discharge Orders    None       Benjiman Core, MD 01/28/19 1610

## 2020-08-26 IMAGING — CR DG CHEST 2V
2 series · 2 of 2 positions shown · non-contrast
Comparison: None.

CLINICAL DATA: Body aches and chills for 4 days.

EXAM:
CHEST - 2 VIEW

[w chest pa]
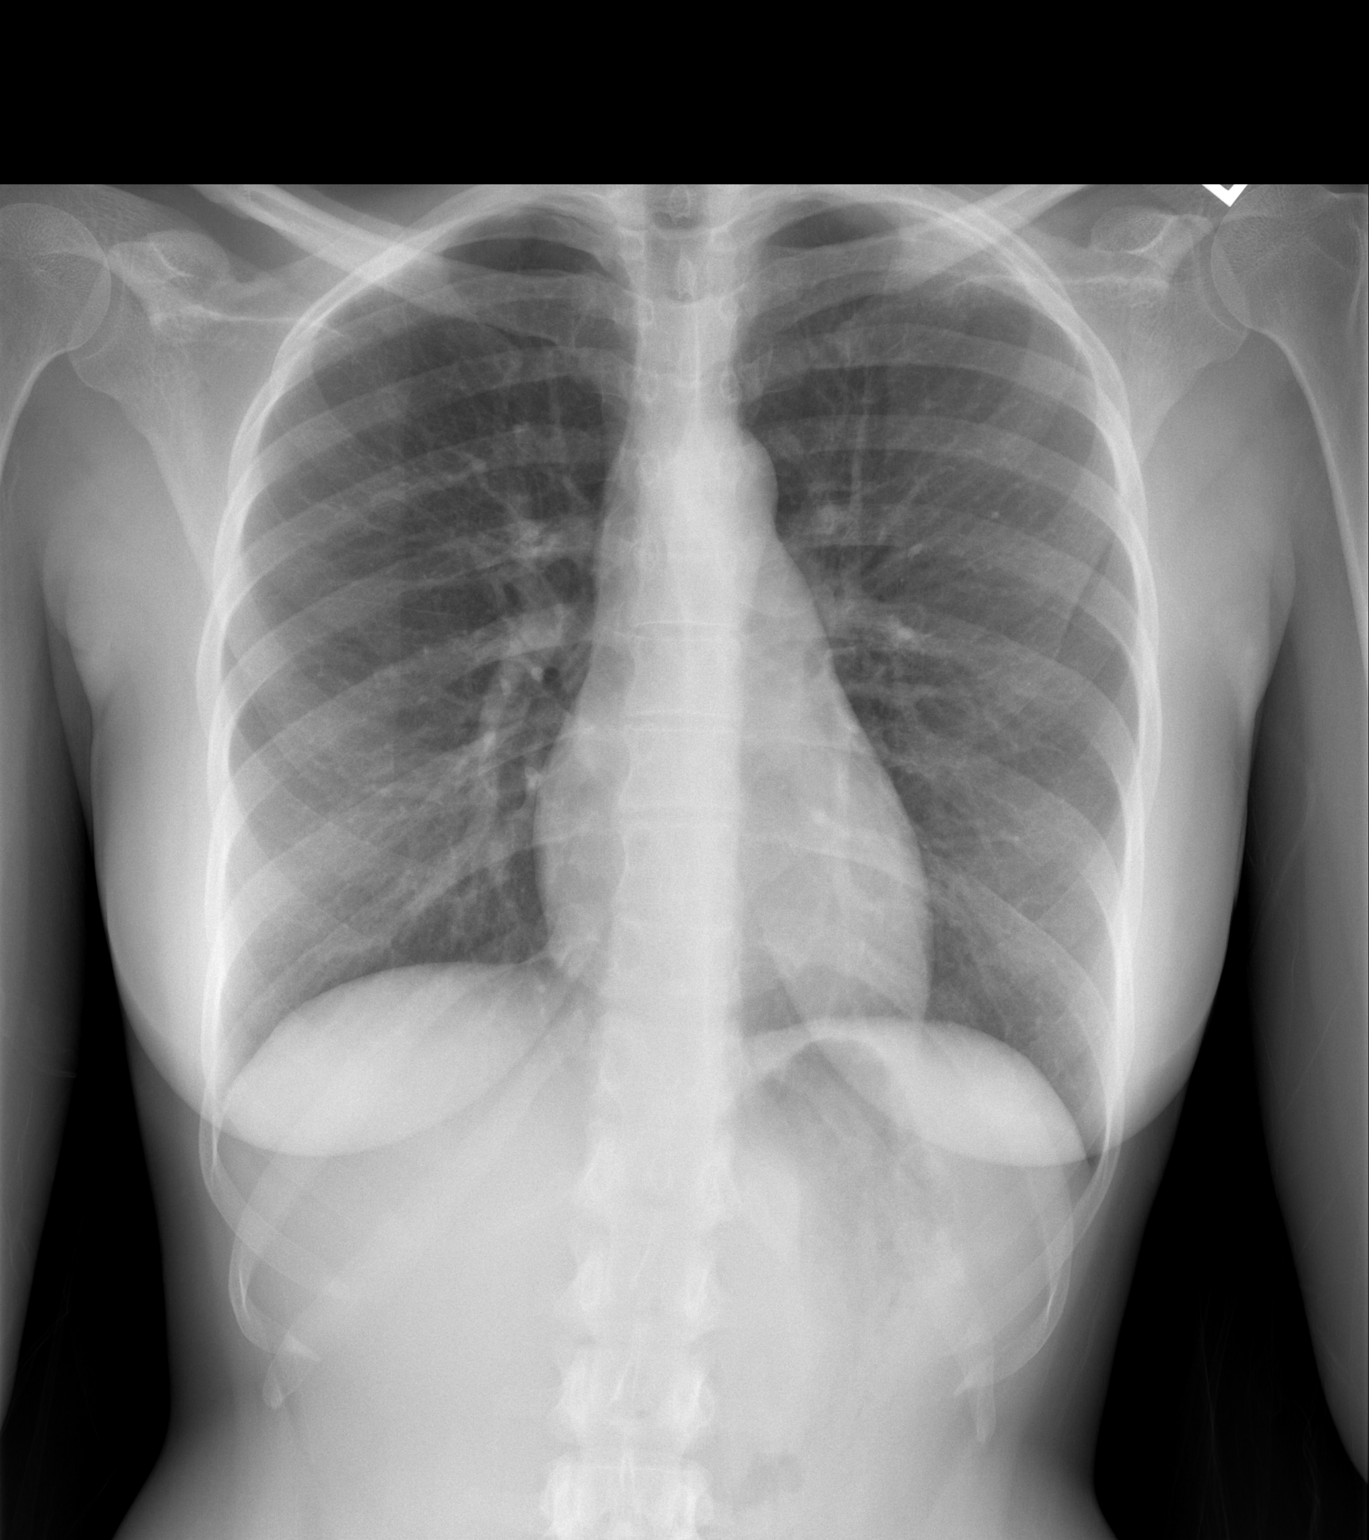

[w chest lat]
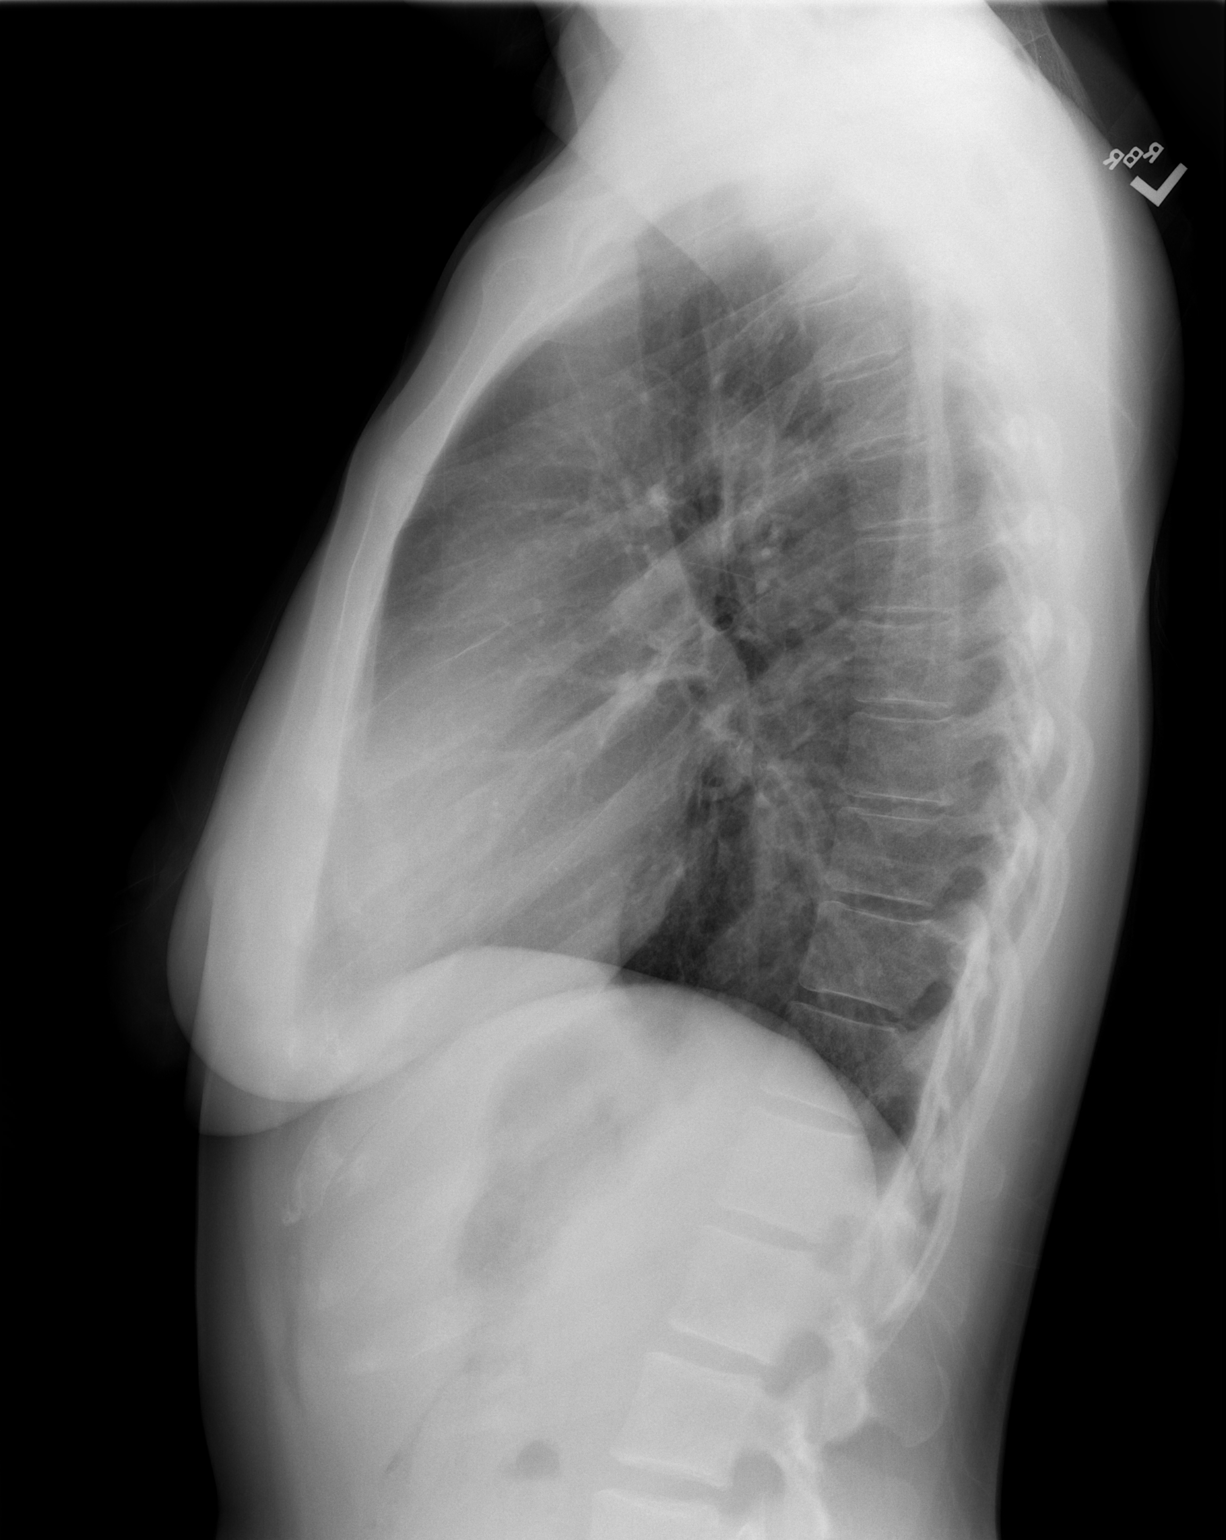

[2 of 2 positions shown; findings below may reference images not displayed]

FINDINGS: The lungs are clear. Heart size is normal. No pneumothorax or
pleural effusion. No acute or focal bony abnormality.
IMPRESSION: Normal chest.

## 2021-02-20 IMAGING — DX DG CHEST 2V
2 series · 2 of 2 positions shown · non-contrast
Comparison: PA and lateral chest 08/03/2018.

CLINICAL DATA: Left chest pain and cough for 2 days.

EXAM:
CHEST - 2 VIEW

[chest pa]
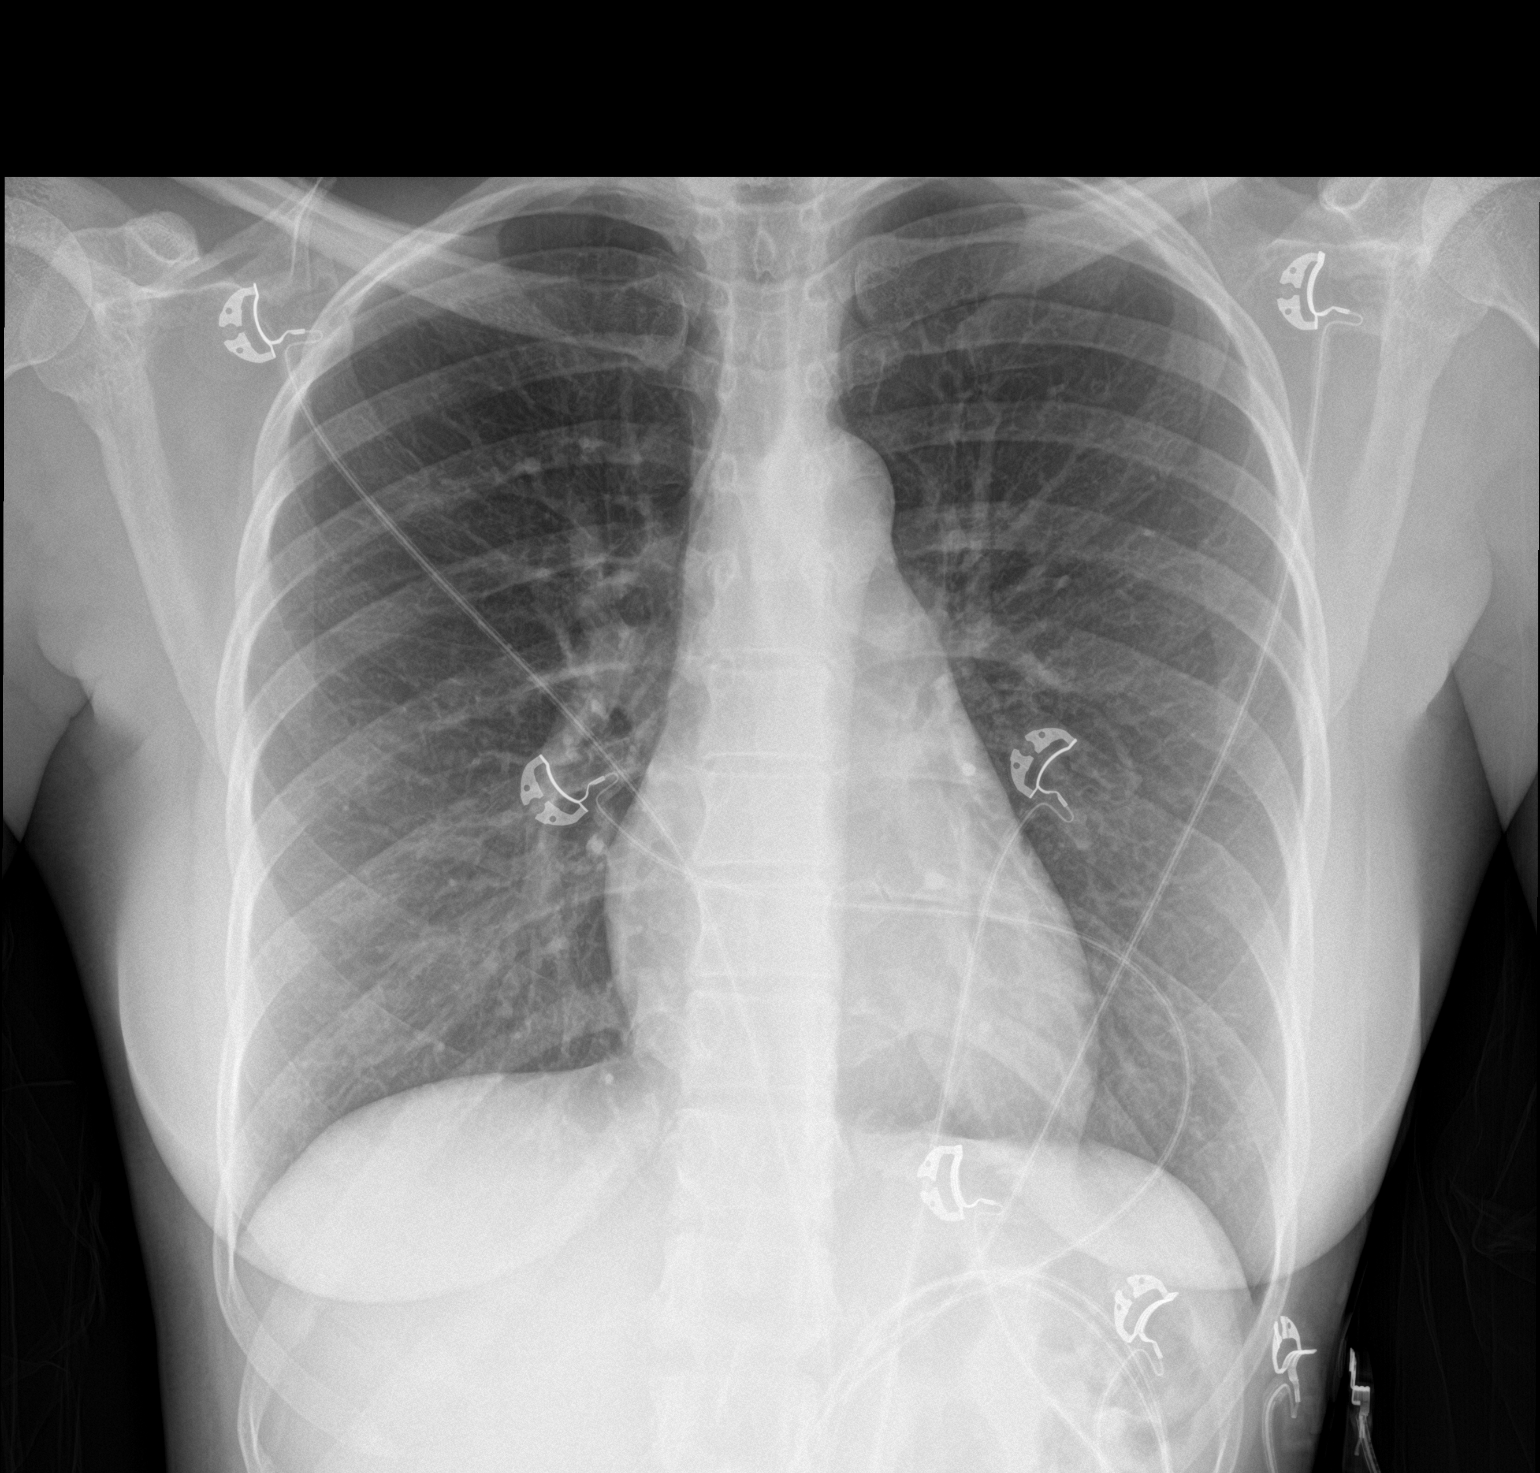

[chest lat]
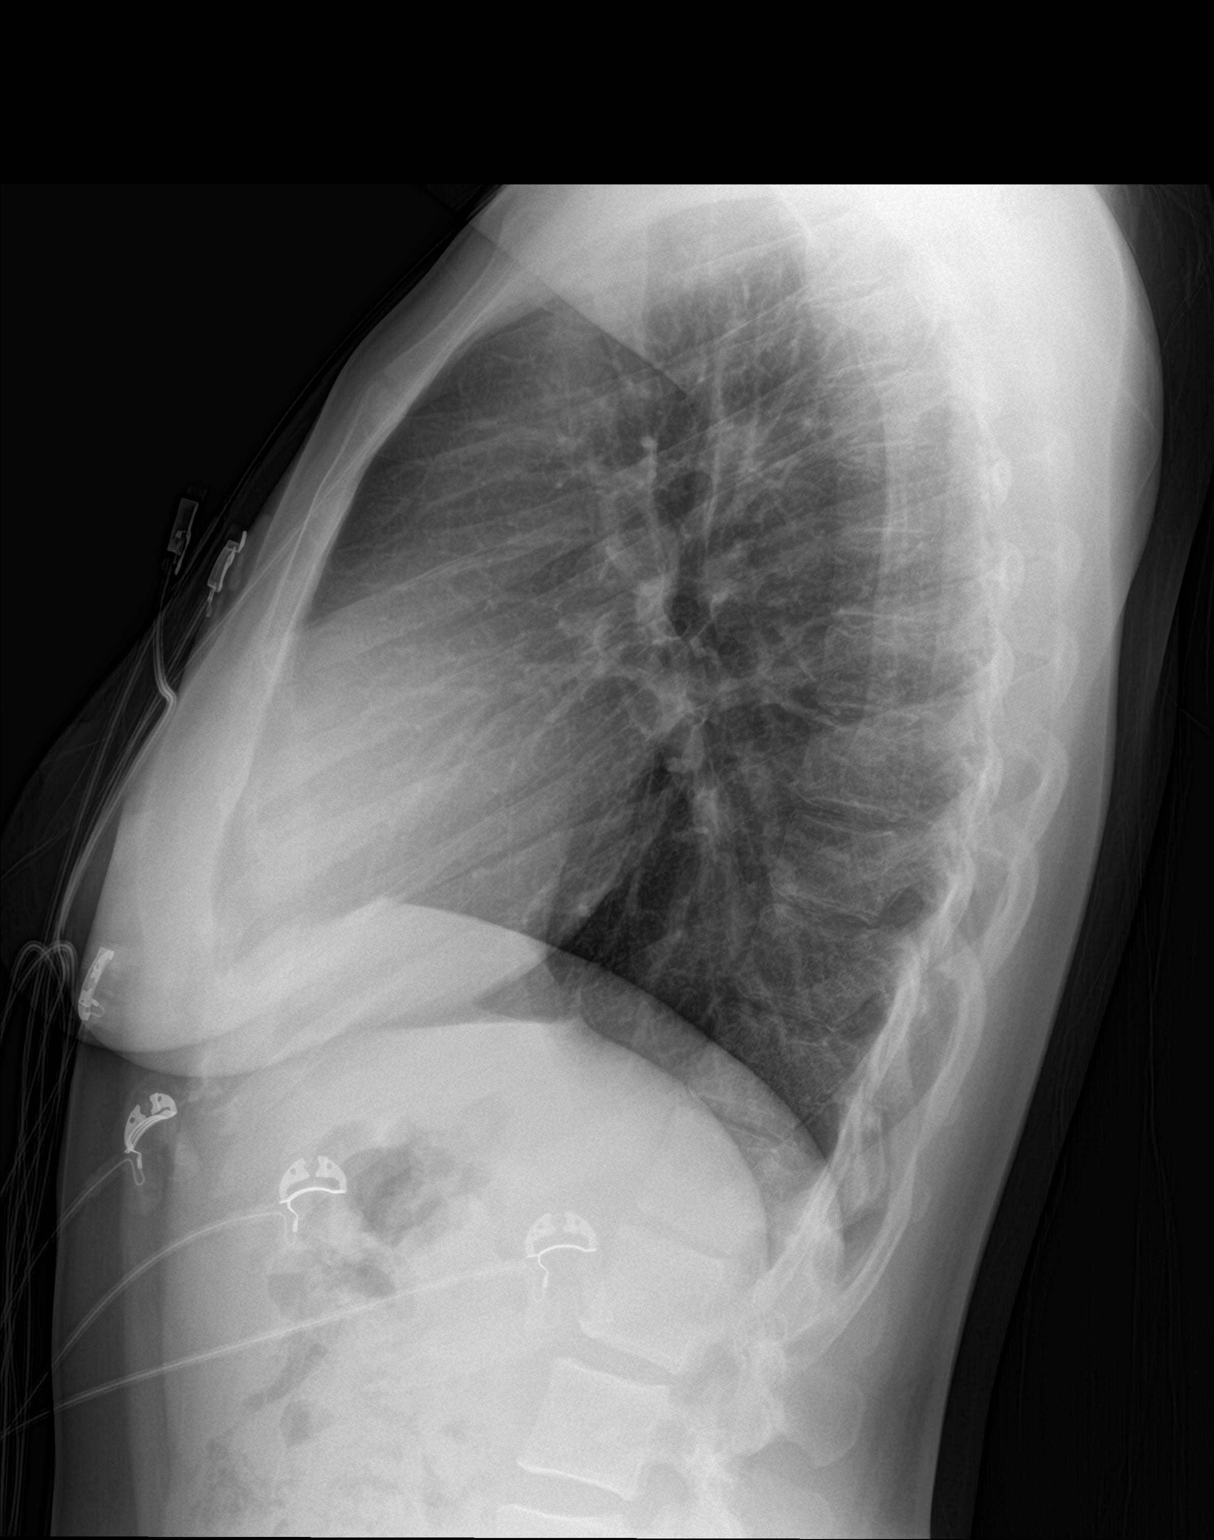

[2 of 2 positions shown; findings below may reference images not displayed]

FINDINGS: Lungs clear. Heart size normal. No pneumothorax or pleural fluid. No
bony abnormality.
IMPRESSION: Normal chest.

## 2021-12-07 ENCOUNTER — Encounter (HOSPITAL_BASED_OUTPATIENT_CLINIC_OR_DEPARTMENT_OTHER): Payer: Self-pay | Admitting: Emergency Medicine

## 2021-12-07 ENCOUNTER — Other Ambulatory Visit (HOSPITAL_BASED_OUTPATIENT_CLINIC_OR_DEPARTMENT_OTHER): Payer: Self-pay

## 2021-12-07 ENCOUNTER — Other Ambulatory Visit: Payer: Self-pay

## 2021-12-07 ENCOUNTER — Emergency Department (HOSPITAL_BASED_OUTPATIENT_CLINIC_OR_DEPARTMENT_OTHER)
Admission: EM | Admit: 2021-12-07 | Discharge: 2021-12-07 | Disposition: A | Discharge status: Home / Self Care | Payer: BC Managed Care – PPO | Attending: Emergency Medicine | Admitting: Emergency Medicine

## 2021-12-07 DIAGNOSIS — M545 Low back pain, unspecified: Secondary | ICD-10-CM | POA: Insufficient documentation

## 2021-12-07 DIAGNOSIS — D72829 Elevated white blood cell count, unspecified: Secondary | ICD-10-CM | POA: Diagnosis not present

## 2021-12-07 DIAGNOSIS — N3001 Acute cystitis with hematuria: Secondary | ICD-10-CM | POA: Diagnosis not present

## 2021-12-07 DIAGNOSIS — R350 Frequency of micturition: Secondary | ICD-10-CM | POA: Diagnosis present

## 2021-12-07 LAB — URINALYSIS, ROUTINE W REFLEX MICROSCOPIC
Bilirubin Urine: NEGATIVE
Glucose, UA: NEGATIVE mg/dL
Ketones, ur: NEGATIVE mg/dL
Nitrite: POSITIVE — AB
Protein, ur: 30 mg/dL — AB
Specific Gravity, Urine: 1.025 (ref 1.005–1.030)
pH: 6.5 (ref 5.0–8.0)

## 2021-12-07 LAB — URINALYSIS, MICROSCOPIC (REFLEX): WBC, UA: >50 WBC/hpf (ref 0–5)

## 2021-12-07 LAB — PREGNANCY, URINE: Preg Test, Ur: NEGATIVE

## 2021-12-07 LAB — CBG MONITORING, ED: Glucose-Capillary: 102 mg/dL — ABNORMAL HIGH (ref 70–99)

## 2021-12-07 MED ORDER — PHENAZOPYRIDINE HCL 100 MG PO TABS
95.0000 mg | ORAL_TABLET | Freq: Once | ORAL | Status: AC
  Administered 2021-12-07: 100 mg via ORAL
  Filled 2021-12-07: qty 1

## 2021-12-07 MED ORDER — CEPHALEXIN 500 MG PO CAPS
500.0000 mg | ORAL_CAPSULE | Freq: Three times a day (TID) | ORAL | 0 refills | Status: AC
  Filled 2021-12-07: qty 21, 7d supply, fill #0

## 2021-12-07 MED ORDER — CEPHALEXIN 250 MG PO CAPS
500.0000 mg | ORAL_CAPSULE | Freq: Once | ORAL | Status: AC
  Administered 2021-12-07: 500 mg via ORAL
  Filled 2021-12-07: qty 2

## 2021-12-07 NOTE — ED Triage Notes (Signed)
Reports urinary frequency and urgency that started yesterday.  Taking cranberry pills with no relief.  Does report some blood on tissue when she wipes.

## 2021-12-07 NOTE — ED Provider Notes (Signed)
[facility] EMERGENCY DEPARTMENT Provider Note   CSN: [id number] Arrival date & time: 12/07/21  [id number]     History  Chief Complaint  Patient presents with   Urinary Frequency    Leslie Gray is a 36 y.o. female.  Patient presents with a 2-day history of urinary urgency, frequency, burning, blood with wiping, suprapubic pain and low back pain.  She is concerned she could have a UTI.  Describes going to the bathroom frequently, urgently and burning when she wipes.  Has had lower abdominal pain and low back pain as well.  No fever.  No vomiting.  No vaginal bleeding or discharge.  History of UTIs in the past and this feels similar.  Taking cranberry pills without relief. No previous abdominal surgeries  The history is provided by the patient.  Urinary Frequency Pertinent negatives include no chest pain, no abdominal pain, no headaches and no shortness of breath.      Home Medications Prior to Admission medications   Medication Sig Start Date End Date Taking? Authorizing Provider  cephALEXin (KEFLEX) 500 MG capsule Take 1 capsule (500 mg total) by mouth 4 (four) times daily. 08/05/18   Law, Bea Graff, PA-C  famotidine (PEPCID) 20 MG tablet Take 1 tablet (20 mg total) by mouth 2 (two) times daily. 08/05/18   Law, Bea Graff, PA-C  fluconazole (DIFLUCAN) 150 MG tablet Take 1 tablet (150 mg total) by mouth daily. 08/15/17   Veryl Speak, MD  hydrochlorothiazide (HYDRODIURIL) 25 MG tablet Take 25 mg by mouth daily.    [provider]  metroNIDAZOLE (FLAGYL) 500 MG tablet Take 1 tablet (500 mg total) by mouth 2 (two) times daily. One po bid x 7 days 01/30/17   Molpus, Jenny Reichmann, MD  Norgestim-Eth Estrad Triphasic [facility] TRI-CYCLEN, 28, PO) Take by mouth.    [provider]  omeprazole (PRILOSEC) 20 MG capsule Take 1 capsule (20 mg total) by mouth daily. 01/30/17   Molpus, John, MD  ondansetron (ZOFRAN) 4 MG tablet Take 1 tablet (4 mg total) by mouth every 6 (six)  hours. 08/05/18   Law, Bea Graff, PA-C  phenazopyridine (PYRIDIUM) 200 MG tablet Take 1 tablet (200 mg total) by mouth 3 (three) times daily as needed for pain. 08/15/17   Veryl Speak, MD  SUMAtriptan (IMITREX) 100 MG tablet Take 1 tablet (100 mg total) by mouth every 2 (two) hours as needed for migraine. May repeat in 2 hours if headache persists or recurs. 08/09/16   Joy, Shawn C, PA-C      Allergies    Patient has no known allergies.    Review of Systems   Review of Systems  Constitutional:  Negative for activity change, appetite change and fever.  HENT:  Negative for congestion and rhinorrhea.   Respiratory:  Negative for cough, chest tightness and shortness of breath.   Cardiovascular:  Negative for chest pain.  Gastrointestinal:  Negative for abdominal pain, nausea and vomiting.  Genitourinary:  Positive for dysuria, frequency and urgency. Negative for flank pain, hematuria, vaginal bleeding and vaginal discharge.  Musculoskeletal:  Positive for back pain. Negative for arthralgias and myalgias.  Skin:  Negative for rash.  Neurological:  Negative for dizziness, weakness and headaches.   all other systems are negative except as noted in the HPI and PMH.   Physical Exam Updated Vital Signs BP (!) 162/100 (BP Location: Right Arm)    Pulse 66    Temp 98.1 F (36.7 C) (Oral)    Resp 18  Ht 5\' 9"  (1.753 m)    Wt 72.6 kg    LMP 11/20/2021    SpO2 99%    BMI 23.63 kg/m  Physical Exam Vitals and nursing note reviewed.  Constitutional:      General: She is not in acute distress.    Appearance: She is well-developed.  HENT:     Head: Normocephalic and atraumatic.     Mouth/Throat:     Pharynx: No oropharyngeal exudate.  Eyes:     Conjunctiva/sclera: Conjunctivae normal.     Pupils: Pupils are equal, round, and reactive to light.  Neck:     Comments: No meningismus. Cardiovascular:     Rate and Rhythm: Normal rate and regular rhythm.     Heart sounds: Normal heart sounds. No  murmur heard. Pulmonary:     Effort: Pulmonary effort is normal. No respiratory distress.     Breath sounds: Normal breath sounds.  Abdominal:     Palpations: Abdomen is soft.     Tenderness: There is abdominal tenderness. There is no guarding or rebound.     Comments: Suprapubic tenderness, no right lower quadrant tenderness  Musculoskeletal:        General: No tenderness. Normal range of motion.     Cervical back: Normal range of motion and neck supple.     Comments: No CVAT Paraspinal lumbar tenderness bilaterally  Skin:    General: Skin is warm.  Neurological:     Mental Status: She is alert and oriented to person, place, and time.     Cranial Nerves: No cranial nerve deficit.     Motor: No abnormal muscle tone.     Coordination: Coordination normal.     Comments:  5/5 strength throughout. CN 2-12 intact.Equal grip strength.   Psychiatric:        Behavior: Behavior normal.    ED Results / Procedures / Treatments   Labs (all labs ordered are listed, but only abnormal results are displayed) Labs Reviewed  URINALYSIS, ROUTINE W REFLEX MICROSCOPIC - Abnormal; Notable for the following components:      Result Value   APPearance TURBID (*)    Hgb urine dipstick LARGE (*)    Protein, ur 30 (*)    Nitrite POSITIVE (*)    Leukocytes,Ua MODERATE (*)    All other components within normal limits  URINALYSIS, MICROSCOPIC (REFLEX) - Abnormal; Notable for the following components:   Bacteria, UA MANY (*)    All other components within normal limits  CBG MONITORING, ED - Abnormal; Notable for the following components:   Glucose-Capillary 102 (*)    All other components within normal limits  URINE CULTURE  PREGNANCY, URINE    EKG None  Radiology No results found.  Procedures Procedures    Medications Ordered in ED Medications - No data to display  ED Course/ Medical Decision Making/ A&P                           Medical Decision Making 2 days of UTI symptoms.   Stable vital signs, no distress. Suprapubic tenderness without peritoneal signs.  Urinalysis concerning for infection with positive nitrite and positive leukocyte esterase.  We will send for culture  Low suspicion for infected kidney stone.  Treat for UTI. Well appearing and nontoxic.  BP elevated and needs followup.  Return to the ED with worsening pain, fever, vomiting, unable to urinate or any other concerns  Final Clinical Impression(s) / ED Diagnoses Final diagnoses:  Acute cystitis with hematuria    Rx / DC Orders ED Discharge Orders     None         Tannya Gonet, Annie Main, MD 12/07/21 [phone]

## 2021-12-07 NOTE — Discharge Instructions (Signed)
Take the antibiotic as prescribed and follow-up with your doctor.  Your urine may turn orange with the Pyridium so do not be alarmed by this.  Return to the ED with worsening pain, fever, vomiting, unable to urinate, not able to tolerate food or drink or any other concerns.

## 2021-12-09 LAB — URINE CULTURE: Culture: >=100000 — AB

## 2021-12-10 ENCOUNTER — Telehealth (HOSPITAL_BASED_OUTPATIENT_CLINIC_OR_DEPARTMENT_OTHER): Payer: Self-pay | Admitting: *Deleted

## 2021-12-10 NOTE — Telephone Encounter (Signed)
Post ED Visit - Positive Culture Follow-up  Culture report reviewed by antimicrobial stewardship pharmacist: [facility] []  , Pharm.D. [x]  Enzo Bi, .D., BCPS AQ-ID []  Celedonio Miyamoto, Pharm.D., BCPS []  [address], Pharm.D., BCPS []  Flora, Garvin Fila.D., BCPS, AAHIVP []  , Pharm.D., BCPS, AAHIVP []  Georgina Pillion, PharmD, BCPS []  , PharmD, BCPS []  [place], PharmD, BCPS []  [address], PharmD []  , PharmD, BCPS []  Estella Husk, PharmD  Pharmacy Team []  Lysle Pearl, PharmD []  , PharmD []  Phillips Climes, PharmD []  , Rph []  Agapito Games) , PharmD []  Verlan Friends, PharmD []  , PharmD []  Mervyn Gay, PharmD []  , PharmD []  Vinnie Level, PharmD []  [facility], PharmD []  , PharmD []  Len Childs, PharmD   Positive urine culture Treated with Cephalexin, organism sensitive to the same and no further patient follow-up is required at this time.  12/10/2021, 11:30 AM

## 2022-11-03 ENCOUNTER — Other Ambulatory Visit: Payer: Self-pay

## 2022-12-11 ENCOUNTER — Other Ambulatory Visit: Payer: Self-pay

## 2022-12-11 ENCOUNTER — Emergency Department (HOSPITAL_BASED_OUTPATIENT_CLINIC_OR_DEPARTMENT_OTHER)
Admission: EM | Admit: 2022-12-11 | Discharge: 2022-12-11 | Disposition: A | Discharge status: Home / Self Care | Payer: BC Managed Care – PPO | Attending: Emergency Medicine | Admitting: Emergency Medicine

## 2022-12-11 ENCOUNTER — Encounter (HOSPITAL_BASED_OUTPATIENT_CLINIC_OR_DEPARTMENT_OTHER): Payer: Self-pay | Admitting: Emergency Medicine

## 2022-12-11 DIAGNOSIS — I10 Essential (primary) hypertension: Secondary | ICD-10-CM | POA: Insufficient documentation

## 2022-12-11 DIAGNOSIS — R519 Headache, unspecified: Secondary | ICD-10-CM | POA: Diagnosis present

## 2022-12-11 DIAGNOSIS — Z79899 Other long term (current) drug therapy: Secondary | ICD-10-CM | POA: Insufficient documentation

## 2022-12-11 LAB — PREGNANCY, URINE: Preg Test, Ur: NEGATIVE

## 2022-12-11 MED ORDER — HYDROCHLOROTHIAZIDE 25 MG PO TABS: 25.0000 mg | ORAL_TABLET | Freq: Every day | ORAL | 1 refills | Status: AC

## 2022-12-11 MED ORDER — KETOROLAC TROMETHAMINE 15 MG/ML IJ SOLN
15.0000 mg | Freq: Once | INTRAMUSCULAR | Status: AC
  Administered 2022-12-11: 15 mg via INTRAVENOUS
  Filled 2022-12-11: qty 1

## 2022-12-11 MED ORDER — DIPHENHYDRAMINE HCL 50 MG/ML IJ SOLN
50.0000 mg | Freq: Once | INTRAMUSCULAR | Status: AC
  Administered 2022-12-11: 50 mg via INTRAVENOUS
  Filled 2022-12-11: qty 1

## 2022-12-11 NOTE — ED Triage Notes (Signed)
Pt arrives pov, steady gait, c/o "migraine" x 3 days, hypertension today. Denies n/v. Pt aox4, speech clear

## 2022-12-11 NOTE — Discharge Instructions (Addendum)
You may take over-the-counter Tylenol or ibuprofen as needed for your headache.  Please take blood pressure medication for better blood pressure control.  Follow-up with your doctor for further care.  Return if you have any concern.

## 2022-12-11 NOTE — ED Provider Notes (Signed)
[facility] EMERGENCY DEPARTMENT Provider Note   CSN: [id number] Arrival date & time: 12/11/22  0844     History  Chief Complaint  Patient presents with   Headache    Leslie Gray is a 37 y.o. female.  The history is provided by the patient and medical records. No language interpreter was used.  Headache    37 year old female significant history of migraine, hypertension, who presenting complaining of headache.  Patient reports gradual onset of left-sided headache ongoing for the past 3 days.  Headache is moderate in intensity and when she checked her blood pressure today it was in the 150s which causes a great consternation.  Headache is persistent, no associated fever chills runny nose sneezing coughing vision changes nausea vomiting focal numbness focal weakness or rash.  Patient admits to eating bacon and pork on a regular basis.  She also reported feeling increased stress.  She just came off her menstruation yesterday.  She tries taking home headache medication without relief.  She denies tobacco or alcohol use.  Home Medications Prior to Admission medications   Medication Sig Start Date End Date Taking? Authorizing Provider  cephALEXin (KEFLEX) 500 MG capsule Take 1 capsule (500 mg total) by mouth 3 (three) times daily. 12/07/21   Rancour, Jeannett Senior, MD  famotidine (PEPCID) 20 MG tablet Take 1 tablet (20 mg total) by mouth 2 (two) times daily. 08/05/18   Law, Waylan Boga, PA-C  fluconazole (DIFLUCAN) 150 MG tablet Take 1 tablet (150 mg total) by mouth daily. 08/15/17   Geoffery Lyons, MD  hydrochlorothiazide (HYDRODIURIL) 25 MG tablet Take 25 mg by mouth daily.    [provider]  metroNIDAZOLE (FLAGYL) 500 MG tablet Take 1 tablet (500 mg total) by mouth 2 (two) times daily. One po bid x 7 days 01/30/17   Molpus, Jonny Ruiz, MD  Norgestim-Eth Estrad Triphasic [facility] TRI-CYCLEN, 28, PO) Take by mouth.    [provider]  omeprazole (PRILOSEC) 20 MG capsule Take 1  capsule (20 mg total) by mouth daily. 01/30/17   Molpus, John, MD  ondansetron (ZOFRAN) 4 MG tablet Take 1 tablet (4 mg total) by mouth every 6 (six) hours. 08/05/18   Law, Waylan Boga, PA-C  phenazopyridine (PYRIDIUM) 200 MG tablet Take 1 tablet (200 mg total) by mouth 3 (three) times daily as needed for pain. 08/15/17   Geoffery Lyons, MD  SUMAtriptan (IMITREX) 100 MG tablet Take 1 tablet (100 mg total) by mouth every 2 (two) hours as needed for migraine. May repeat in 2 hours if headache persists or recurs. 08/09/16   Joy, Shawn C, PA-C      Allergies    Patient has no known allergies.    Review of Systems   Review of Systems  Neurological:  Positive for headaches.  All other systems reviewed and are negative.   Physical Exam Updated Vital Signs BP (!) 162/110 (BP Location: Right Arm)   Pulse 65   Temp 98.4 F (36.9 C) (Oral)   Resp 17   Wt 83.3 kg   SpO2 100%   BMI 27.12 kg/m  Physical Exam Vitals and nursing note reviewed.  Constitutional:      General: She is not in acute distress.    Appearance: She is well-developed.     Comments: Patient is tearful but nontoxic  HENT:     Head: Atraumatic.  Eyes:     Extraocular Movements: Extraocular movements intact.     Conjunctiva/sclera: Conjunctivae normal.     Pupils: Pupils  are equal, round, and reactive to light.  Cardiovascular:     Rate and Rhythm: Normal rate and regular rhythm.  Pulmonary:     Effort: Pulmonary effort is normal.  Musculoskeletal:        General: Normal range of motion.     Cervical back: Normal range of motion and neck supple. No rigidity or tenderness.  Skin:    Findings: No rash.  Neurological:     Mental Status: She is alert and oriented to person, place, and time.     Cranial Nerves: No cranial nerve deficit.     Sensory: No sensory deficit.     Motor: No weakness.     Coordination: Coordination normal.  Psychiatric:        Mood and Affect: Mood normal.     ED Results / Procedures /  Treatments   Labs (all labs ordered are listed, but only abnormal results are displayed) Labs Reviewed  PREGNANCY, URINE    EKG None  Date: 12/11/2022  Rate: 66  Rhythm: normal sinus rhythm  QRS Axis: normal  Intervals: normal  ST/T Wave abnormalities: normal  Conduction Disutrbances: none  Narrative Interpretation:   Old EKG Reviewed: No significant changes noted    Radiology No results found.  Procedures Procedures    Medications Ordered in ED Medications  ketorolac (TORADOL) 15 MG/ML injection 15 mg (15 mg Intravenous Given 12/11/22 0940)  diphenhydrAMINE (BENADRYL) injection 50 mg (50 mg Intravenous Given 12/11/22 0940)    ED Course/ Medical Decision Making/ A&P                           Medical Decision Making Amount and/or Complexity of Data Reviewed Labs: ordered.  Risk Prescription drug management.   BP (!) 162/110 (BP Location: Right Arm)   Pulse 65   Temp 98.4 F (36.9 C) (Oral)   Resp 17   Wt 83.3 kg   LMP 12/09/2022   SpO2 100%   BMI 27.12 kg/m   24:56 AM  37 year old female significant history of migraine, hypertension, who presenting complaining of headache.  Patient reports gradual onset of left-sided headache ongoing for the past 3 days.  Headache is moderate in intensity and when she checked her blood pressure today it was in the 150s which causes a great consternation.  Headache is persistent, no associated fever chills runny nose sneezing coughing vision changes nausea vomiting focal numbness focal weakness or rash.  Patient admits to eating bacon and pork on a regular basis.  She also reported feeling increased stress.  She just came off her menstruation yesterday.  She tries taking home headache medication without relief.  She denies tobacco or alcohol use.  On exam, patient is laying in bed crying but in no acute discomfort.  She is answering questions appropriately, is alert and oriented x 4 and she is without any focal neurodeficit.  She  does not have any nuchal rigidity concerning for meningitis.  She does not have any focal weakness.  There are no concerning rash.  She is mentating appropriately.  Vital sign review remarkable for an elevated blood pressure of 162/110.  She is afebrile no hypoxia.  -Labs ordered, independently viewed and interpreted by me.  Labs remarkable for negative pregnancy test -The patient was maintained on a cardiac monitor.  I personally viewed and interpreted the cardiac monitored which showed an underlying rhythm of: NSR -This patient presents to the ED for concern of headache, this  involves an extensive number of treatment options, and is a complaint that carries with it a high risk of complications and morbidity.  The differential diagnosis includes migraine, SAH, tension headache, cluster headache, hypertensive emergency, hypertensive urgency -Co morbidities that complicate the patient evaluation includes migraine -Treatment includes migraine cocktail -Reevaluation of the patient after these medicines showed that the patient improved -PCP office notes or outside notes reviewed -Escalation to admission/observation considered: patients feels much better, is comfortable with discharge, and will follow up with PCP -Prescription medication considered, patient comfortable with tylenol BP medication -Social Determinant of Health considered which includes tobacco use  11:30 AM  Patient here with headache improved with migraine cocktail.  No red flags.  At this time she is stable to be discharged home. She does have a blood blood pressure consistent with uncontrolled hypertension.  Will prescribe blood pressure medication with appropriate ratio and recommend outpatient follow-up for further management.         Final Clinical Impression(s) / ED Diagnoses Final diagnoses:  Bad headache  Uncontrolled hypertension    Rx / DC Orders ED Discharge Orders          Ordered    hydrochlorothiazide  (HYDRODIURIL) 25 MG tablet  Daily        12/11/22 1143              Domenic Moras, PA-C 12/11/22 1146    Gareth Morgan, MD 12/11/22 2259

## 2023-08-02 ENCOUNTER — Other Ambulatory Visit: Payer: Self-pay

## 2023-08-02 ENCOUNTER — Encounter (HOSPITAL_BASED_OUTPATIENT_CLINIC_OR_DEPARTMENT_OTHER): Payer: Self-pay | Admitting: Emergency Medicine

## 2023-08-02 ENCOUNTER — Other Ambulatory Visit (HOSPITAL_BASED_OUTPATIENT_CLINIC_OR_DEPARTMENT_OTHER): Payer: Self-pay

## 2023-08-02 ENCOUNTER — Emergency Department (HOSPITAL_BASED_OUTPATIENT_CLINIC_OR_DEPARTMENT_OTHER)
Admission: EM | Admit: 2023-08-02 | Discharge: 2023-08-02 | Disposition: A | Discharge status: Home / Self Care | Payer: BC Managed Care – PPO | Attending: Emergency Medicine | Admitting: Emergency Medicine

## 2023-08-02 DIAGNOSIS — R2242 Localized swelling, mass and lump, left lower limb: Secondary | ICD-10-CM | POA: Insufficient documentation

## 2023-08-02 DIAGNOSIS — I10 Essential (primary) hypertension: Secondary | ICD-10-CM | POA: Insufficient documentation

## 2023-08-02 DIAGNOSIS — Z87891 Personal history of nicotine dependence: Secondary | ICD-10-CM | POA: Diagnosis not present

## 2023-08-02 MED ORDER — DEXAMETHASONE 4 MG PO TABS
10.0000 mg | ORAL_TABLET | Freq: Once | ORAL | Status: AC
Start: 2023-08-02 — End: 2023-08-02
  Administered 2023-08-02: 10 mg via ORAL
  Filled 2023-08-02: qty 3

## 2023-08-02 MED ORDER — DIPHENHYDRAMINE HCL 25 MG PO TABS
25.0000 mg | ORAL_TABLET | ORAL | 0 refills | Status: AC | PRN
  Filled 2023-08-02: qty 100, 17d supply, fill #0

## 2023-08-02 NOTE — ED Triage Notes (Signed)
Pt in with L foot itching and swelling x 2 days. Pt thinks she possibly got bitten by an ant while outside Sunday afternoon. Area swollen, warm and red to touch

## 2023-08-02 NOTE — Discharge Instructions (Addendum)
You were evaluated in the Emergency Department and after careful evaluation, we did not find any emergent condition requiring admission or further testing in the hospital.  Your exam/testing today is overall reassuring.  Symptoms likely due to a local allergic reaction to a bite or sting.  Use the Benadryl as needed for itching.  Elevate the foot as we discussed.  Please return to the Emergency Department if you experience any worsening of your condition.   Thank you for allowing [place] to be a part of your care.

## 2023-08-02 NOTE — ED Provider Notes (Signed)
[facility] DEPT [facility] Emergency Department Provider Note MRN:  [id number]  Arrival date & time: 08/02/23     Chief Complaint   L Foot Swelling   History of Present Illness   Leslie Gray is a 37 y.o. year-old female with no pertinent past medical history presenting to the ED with chief complaint of foot swelling.  Thinks maybe she was bitten by an ant a couple days ago.  Continued redness and swelling to the left foot.  Small little pustule as well.  The foot is very itchy.  No pain, no fever  Review of Systems  A thorough review of systems was obtained and all systems are negative except as noted in the HPI and PMH.   Patient's Health History    Past Medical History:  Diagnosis Date   Hypertension    Migraine     History reviewed. No pertinent surgical history.  History reviewed. No pertinent family history.  Social History   Socioeconomic History   Marital status: Single    Spouse name: Not on file   Number of children: Not on file   Years of education: Not on file   Highest education level: Not on file  Occupational History   Not on file  Tobacco Use   Smoking status: Former   Smokeless tobacco: Never  Vaping Use   Vaping status: Never Used  Substance and Sexual Activity   Alcohol use: Yes    Comment: socially   Drug use: No   Sexual activity: Yes    Birth control/protection: Pill  Other Topics Concern   Not on file  Social History Narrative   Not on file   Social Determinants of Health   Financial Resource Strain: Not on file  Food Insecurity: Not on file  Transportation Needs: Not on file  Physical Activity: Not on file  Stress: Not on file  Social Connections: Not on file  Intimate Partner Violence: Not on file     Physical Exam   Vitals:   08/02/23 0504 08/02/23 0506  BP: (!) 178/105   Pulse: 64   Resp: 18   Temp:  98.2 F (36.8 C)  SpO2: 99%     CONSTITUTIONAL: Well-appearing, NAD NEURO/PSYCH:  Alert and  oriented x 3, no focal deficits EYES:  eyes equal and reactive ENT/NECK:  no LAD, no JVD CARDIO: Regular rate, well-perfused, normal S1 and S2 PULM:  CTAB no wheezing or rhonchi GI/GU:  non-distended, non-tender MSK/SPINE:  No gross deformities, no edema SKIN:  no rash, atraumatic   *Additional and/or pertinent findings included in MDM below  Diagnostic and Interventional Summary    EKG Interpretation Date/Time:    Ventricular Rate:    PR Interval:    QRS Duration:    QT Interval:    QTC Calculation:   R Axis:      Text Interpretation:         Labs Reviewed - No data to display  No orders to display    Medications  dexamethasone (DECADRON) tablet 10 mg (10 mg Oral Given 08/02/23 0520)     Procedures  /  Critical Care Procedures  ED Course and Medical Decision Making  Initial Impression and Ddx Swelling erythema and increased warmth to the left foot but does not seem consistent with cellulitis.  Seems more like an inflammatory response.  There is a tiny pustule and so suspect an ant bite.  No other symptoms  Past medical/surgical history that increases complexity of ED encounter:  None  Interpretation of Diagnostics Laboratory and/or imaging options to aid in the diagnosis/care of the patient were considered.  After careful history and physical examination, it was determined that there was no indication for diagnostics at this time.  Patient Reassessment and Ultimate Disposition/Management     Discharge  Patient management required discussion with the following services or consulting groups:  None  Complexity of Problems Addressed Acute complicated illness or Injury  Additional Data Reviewed and Analyzed Further history obtained from: None  Additional Factors Impacting ED Encounter Risk Prescriptions  Elmer Sow. Pilar Plate, MD [facility] Emergency Medicine [facility] [email or web address]  Final Clinical Impressions(s) / ED Diagnoses      ICD-10-CM   1. Localized swelling of left foot  R22.42       ED Discharge Orders          Ordered    diphenhydrAMINE (BENADRYL) 25 MG tablet  Every 4 hours PRN        08/02/23 0521             Discharge Instructions Discussed with and Provided to Patient:    Discharge Instructions      You were evaluated in the Emergency Department and after careful evaluation, we did not find any emergent condition requiring admission or further testing in the hospital.  Your exam/testing today is overall reassuring.  Symptoms likely due to a local allergic reaction to a bite or sting.  Use the Benadryl as needed for itching.  Elevate the foot as we discussed.  Please return to the Emergency Department if you experience any worsening of your condition.   Thank you for allowing [place] to be a part of your care.      Sabas Sous, MD 08/02/23 [phone]

## 2023-08-11 ENCOUNTER — Other Ambulatory Visit (HOSPITAL_BASED_OUTPATIENT_CLINIC_OR_DEPARTMENT_OTHER): Payer: Self-pay

## 2024-07-13 ENCOUNTER — Other Ambulatory Visit: Payer: Self-pay

## 2024-07-13 ENCOUNTER — Emergency Department (HOSPITAL_BASED_OUTPATIENT_CLINIC_OR_DEPARTMENT_OTHER)
Admission: EM | Admit: 2024-07-13 | Discharge: 2024-07-13 | Disposition: A | Discharge status: Home / Self Care | Payer: Self-pay | Attending: Emergency Medicine | Admitting: Emergency Medicine

## 2024-07-13 ENCOUNTER — Encounter (HOSPITAL_BASED_OUTPATIENT_CLINIC_OR_DEPARTMENT_OTHER): Payer: Self-pay | Admitting: Emergency Medicine

## 2024-07-13 DIAGNOSIS — I10 Essential (primary) hypertension: Secondary | ICD-10-CM | POA: Insufficient documentation

## 2024-07-13 DIAGNOSIS — L500 Allergic urticaria: Secondary | ICD-10-CM | POA: Insufficient documentation

## 2024-07-13 DIAGNOSIS — T782XXA Anaphylactic shock, unspecified, initial encounter: Secondary | ICD-10-CM | POA: Insufficient documentation

## 2024-07-13 LAB — PREGNANCY, URINE: Preg Test, Ur: NEGATIVE

## 2024-07-13 MED ORDER — EPINEPHRINE 0.3 MG/0.3ML IJ SOAJ
0.3000 mg | Freq: Once | INTRAMUSCULAR | Status: AC
  Administered 2024-07-13: 0.3 mg via INTRAMUSCULAR
  Filled 2024-07-13: qty 0.3

## 2024-07-13 MED ORDER — METHYLPREDNISOLONE SODIUM SUCC 125 MG IJ SOLR
125.0000 mg | Freq: Once | INTRAMUSCULAR | Status: AC
  Administered 2024-07-13: 125 mg via INTRAVENOUS
  Filled 2024-07-13: qty 2

## 2024-07-13 MED ORDER — EPINEPHRINE 0.3 MG/0.3ML IJ SOAJ
0.3000 mg | INTRAMUSCULAR | 0 refills | Status: AC | PRN
Start: 2024-07-13 — End: ?
  Filled 2024-07-13: qty 2, 2d supply, fill #0

## 2024-07-13 MED ORDER — SODIUM CHLORIDE 0.9 % IV SOLN: INTRAVENOUS | Status: DC

## 2024-07-13 MED ORDER — FAMOTIDINE IN NACL 20-0.9 MG/50ML-% IV SOLN
20.0000 mg | Freq: Once | INTRAVENOUS | Status: AC
  Administered 2024-07-13: 20 mg via INTRAVENOUS
  Filled 2024-07-13: qty 50

## 2024-07-13 MED ORDER — TETANUS-DIPHTH-ACELL PERTUSSIS 5-2.5-18.5 LF-MCG/0.5 IM SUSY
0.5000 mL | PREFILLED_SYRINGE | Freq: Once | INTRAMUSCULAR | Status: AC
  Administered 2024-07-13: 0.5 mL via INTRAMUSCULAR
  Filled 2024-07-13: qty 0.5

## 2024-07-13 MED ORDER — DIPHENHYDRAMINE HCL 50 MG/ML IJ SOLN
25.0000 mg | Freq: Once | INTRAMUSCULAR | Status: AC
  Administered 2024-07-13: 25 mg via INTRAVENOUS
  Filled 2024-07-13: qty 1

## 2024-07-13 NOTE — ED Provider Notes (Signed)
 Accepted handoff at shift change from Leita Chancy PA-C. Please see prior provider note for more detail.   Briefly: Patient is 38 y.o. presents today for hives after she was bitten by fire ants around 3 PM today at a funeral.  Patient denies difficulty breathing or swallowing.  Patient was given Solu-Medrol , Benadryl , Pepcid  and epinephrine .  DDX: concern for allergic reaction  Plan: Observation until 740 and discharge with EpiPen   Physical Exam  BP (!) 143/91   Pulse 71   Temp 98.7 F (37.1 C)   Resp 18   Ht 5' 9 (1.753 m)   Wt 84.8 kg   LMP 07/12/2024 (Approximate)   SpO2 99%   BMI 27.62 kg/m   Physical Exam Constitutional:      General: She is not in acute distress.    Appearance: Normal appearance.  HENT:     Head: Normocephalic and atraumatic.  Cardiovascular:     Rate and Rhythm: Normal rate.  Pulmonary:     Effort: Pulmonary effort is normal. No respiratory distress.  Skin:    General: Skin is warm and dry.     Capillary Refill: Capillary refill takes less than 2 seconds.     Comments: Patient with very minimal hives on upper and lower extremities  Neurological:     Mental Status: She is alert.     Procedures  Procedures  ED Course / MDM    Medical Decision Making Amount and/or Complexity of Data Reviewed Labs: ordered.  Risk Prescription drug management.    Considered for admission or further workup however patient's vital signs and physical exam are reassuring.  Patient is not having any rebounding symptoms.  Patient will be discharged with EpiPen  prescription.  Patient given return precautions.  I feel patient is safe for discharge at this time.      Francis Ileana SAILOR, PA-C 07/13/24 2003    Emil Share, DO 07/13/24 2011

## 2024-07-13 NOTE — ED Provider Notes (Signed)
 [place] EMERGENCY DEPARTMENT AT [facility] [place] Provider Note   CSN: [id number] Arrival date & time: 07/13/24  1525     Patient presents with: Allergic Reaction   Leslie Gray is a 38 y.o. female.   38 year old female with complaint of diffuse hives after she was bitten by fire ants around 3:00 pm today at a funeral. Denies vomiting, difficulty breathing or swallowing. No known allergies . Last td unknown.        Prior to Admission medications   Medication Sig Start Date End Date Taking? Authorizing Provider  EPINEPHrine  0.3 mg/0.3 mL IJ SOAJ injection Inject 0.3 mg into the muscle as needed for anaphylaxis. 07/13/24  Yes Beverley Leita LABOR, PA-C  cephALEXin  (KEFLEX ) 500 MG capsule Take 1 capsule (500 mg total) by mouth 3 (three) times daily. 12/07/21   Rancour, Garnette, MD  diphenhydrAMINE  (BENADRYL ) 25 MG tablet Take 1 tablet (25 mg total) by mouth every 4 (four) hours as needed for itching. 08/02/23   Theadore Ozell HERO, MD  famotidine  (PEPCID ) 20 MG tablet Take 1 tablet (20 mg total) by mouth 2 (two) times daily. 08/05/18   Law, Alexandra M, PA-C  fluconazole  (DIFLUCAN ) 150 MG tablet Take 1 tablet (150 mg total) by mouth daily. 08/15/17   Geroldine Berg, MD  hydrochlorothiazide  (HYDRODIURIL ) 25 MG tablet Take 1 tablet (25 mg total) by mouth daily. 12/11/22   Nivia Colon, PA-C  metroNIDAZOLE  (FLAGYL ) 500 MG tablet Take 1 tablet (500 mg total) by mouth 2 (two) times daily. One po bid x 7 days 01/30/17   Molpus, Norleen, MD  [facility] TRI-CYCLEN, 28, PO) Take by mouth.    [provider]  omeprazole  (PRILOSEC) 20 MG capsule Take 1 capsule (20 mg total) by mouth daily. 01/30/17   Molpus, John, MD  ondansetron  (ZOFRAN ) 4 MG tablet Take 1 tablet (4 mg total) by mouth every 6 (six) hours. 08/05/18   Law, Alexandra M, PA-C  phenazopyridine  (PYRIDIUM ) 200 MG tablet Take 1 tablet (200 mg total) by mouth 3 (three) times daily as needed for pain. 08/15/17   Geroldine Berg, MD  SUMAtriptan  (IMITREX ) 100 MG tablet Take 1 tablet (100 mg total) by mouth every 2 (two) hours as needed for migraine. May repeat in 2 hours if headache persists or recurs. 08/09/16   Joy, Elouise BROCKS, PA-C    Allergies: Fire ant    Review of Systems Negative except as per HPI Updated Vital Signs BP 125/89   Pulse 66   Temp 98.1 F (36.7 C) (Oral)   Resp 16   Ht 5' 9 (1.753 m)   Wt 84.8 kg   LMP 07/12/2024 (Approximate)   SpO2 100%   BMI 27.62 kg/m   Physical Exam Vitals and nursing note reviewed.  Constitutional:      General: She is not in acute distress.    Appearance: She is well-developed. She is not diaphoretic.  HENT:     Head: Normocephalic and atraumatic.  Cardiovascular:     Rate and Rhythm: Normal rate and regular rhythm.     Heart sounds: Normal heart sounds.  Pulmonary:     Effort: Pulmonary effort is normal.     Breath sounds: Normal breath sounds.  Skin:    General: Skin is warm and dry.     Findings: Rash present.     Comments: Diffuse urticarial rash, mild edema of lips  Neurological:     Mental Status: She is alert and oriented to  person, place, and time.  Psychiatric:        Behavior: Behavior normal.     (all labs ordered are listed, but only abnormal results are displayed) Labs Reviewed  PREGNANCY, URINE    EKG: EKG Interpretation Date/Time:  Friday July 13 2024 15:45:05 EDT Ventricular Rate:  73 PR Interval:  163 QRS Duration:  98 QT Interval:  404 QTC Calculation: 446 R Axis:   95  Text Interpretation: Sinus rhythm Borderline right axis deviation Minimal ST depression, inferior leads Baseline wander TECHNICALLY DIFFICULT Otherwise no significant change Confirmed by Emil Share [phone]) on 07/13/2024 4:13:37 PM  Radiology: No results found.   .Critical Care  Performed by: Beverley Leita LABOR, PA-C Authorized by: Beverley Leita LABOR, PA-C   Critical care provider statement:    Critical care time (minutes):  30   Critical care  was time spent personally by me on the following activities:  Development of treatment plan with patient or surrogate, discussions with consultants, evaluation of patient's response to treatment, examination of patient, ordering and review of laboratory studies, ordering and review of radiographic studies, ordering and performing treatments and interventions, pulse oximetry, re-evaluation of patient's condition and review of old charts    Medications Ordered in the ED  0.9 %  sodium chloride  infusion ( Intravenous New Bag/Given 07/13/24 1553)  diphenhydrAMINE  (BENADRYL ) injection 25 mg (25 mg Intravenous Given 07/13/24 1549)  EPINEPHrine  (EPI-PEN) injection 0.3 mg (0.3 mg Intramuscular Given 07/13/24 1540)  methylPREDNISolone  sodium succinate (SOLU-MEDROL ) 125 mg/2 mL injection 125 mg (125 mg Intravenous Given 07/13/24 1548)  famotidine  (PEPCID ) IVPB 20 mg premix (0 mg Intravenous Stopped 07/13/24 1629)  Tdap (BOOSTRIX) injection 0.5 mL (0.5 mLs Intramuscular Given 07/13/24 1554)                                    Medical Decision Making Amount and/or Complexity of Data Reviewed Labs: ordered.  Risk Prescription drug management.   This patient presents to the ED for concern of allergic reaction, this involves an extensive number of treatment options, and is a complaint that carries with it a high risk of complications and morbidity.  The differential diagnosis includes anaphylaxis    Co morbidities / Chronic conditions that complicate the patient evaluation  Migraines, hypertension   Additional history obtained:  Additional history obtained from EMR External records from outside source obtained and reviewed including prior labs on file   Lab Tests:  I Ordered, and personally interpreted labs.  The pertinent results include: U. Preg   Cardiac Monitoring: / EKG:  The patient was maintained on a cardiac monitor.  I personally viewed and interpreted the cardiac monitored which showed an  underlying rhythm of: Sinus rhythm, rate 73   Problem List / ED Course / Critical interventions / Medication management  38 year old female presents 30 minutes after fire ant bite to her feet while at a funeral.  Found to have diffuse urticarial rash with mild lip swelling, lungs clear to auscultation, oropharynx normal.  Provided with epi, Solu-Medrol , Benadryl , Pepcid .  Monitored.  Rash gradually improving.  Does report persistent itching.  Plan is to hold for remaining observation window post appendectomy.  If no rebound, can likely discharge with prescription for EpiPen . I ordered medication including epi, Solu-Medrol , Pepcid , Benadryl  Reevaluation of the patient after these medicines showed that the patient gradually improving  I have reviewed the patients home medicines and have made adjustments  as needed   Consultations Obtained:  I requested consultation with the ER attending, Dr. Emil,  and discussed lab and imaging findings as well as pertinent plan - they recommend: Agrees with plan of care   Social Determinants of Health:  Has PCP   Test / Admission - Considered:  Disposition pending at time of signout.      Final diagnoses:  Anaphylaxis, initial encounter    ED Discharge Orders          Ordered    EPINEPHrine  0.3 mg/0.3 mL IJ SOAJ injection  As needed        07/13/24 1846               Beverley Leita LABOR, PA-C 07/13/24 1849    Emil Share, DO 07/13/24 [facility]

## 2024-07-13 NOTE — ED Triage Notes (Signed)
 Pt reports being bitten by ants at funeral. Reports hx of mild allergic reaction in past.  Denies hx of anaphylaxis.  RT to triage to assess, airway patent, respirations equal and unlabored.

## 2024-07-13 NOTE — Discharge Instructions (Addendum)
 Return to the ER for any worsening or concerning symptoms. Follow up with your PCP for recheck.

## 2024-07-15 ENCOUNTER — Other Ambulatory Visit (HOSPITAL_BASED_OUTPATIENT_CLINIC_OR_DEPARTMENT_OTHER): Payer: Self-pay

## 2024-07-16 ENCOUNTER — Other Ambulatory Visit (HOSPITAL_BASED_OUTPATIENT_CLINIC_OR_DEPARTMENT_OTHER): Payer: Self-pay

## 2024-07-23 ENCOUNTER — Other Ambulatory Visit: Payer: Self-pay

## 2024-07-27 ENCOUNTER — Other Ambulatory Visit (HOSPITAL_BASED_OUTPATIENT_CLINIC_OR_DEPARTMENT_OTHER): Payer: Self-pay
